# Patient Record
Sex: Female | Born: 1995
Health system: Southern US, Community
[De-identification: ages and names within clinical notes are randomized; demographics above are authoritative.]

## PROBLEM LIST (undated history)

## (undated) DIAGNOSIS — Q221 Congenital pulmonary valve stenosis: Secondary | ICD-10-CM

## (undated) DIAGNOSIS — J309 Allergic rhinitis, unspecified: Secondary | ICD-10-CM

## (undated) DIAGNOSIS — Q250 Patent ductus arteriosus: Secondary | ICD-10-CM

## (undated) DIAGNOSIS — N390 Urinary tract infection, site not specified: Secondary | ICD-10-CM

## (undated) DIAGNOSIS — I519 Heart disease, unspecified: Secondary | ICD-10-CM

## (undated) HISTORY — DX: Heart disease, unspecified: I51.9

## (undated) HISTORY — DX: Allergic rhinitis, unspecified: J30.9

## (undated) HISTORY — PX: WISDOM TOOTH EXTRACTION: SHX21

## (undated) HISTORY — PX: PULMONARY VALVULOPLASTY: SHX753

## (undated) HISTORY — DX: Patent ductus arteriosus: Q25.0

## (undated) HISTORY — DX: Urinary tract infection, site not specified: N39.0

## (undated) HISTORY — DX: Congenital pulmonary valve stenosis: Q22.1

---
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
## 2014-04-16 ENCOUNTER — Encounter: Payer: Self-pay | Admitting: Family Medicine

## 2014-04-16 ENCOUNTER — Ambulatory Visit (INDEPENDENT_AMBULATORY_CARE_PROVIDER_SITE_OTHER): Payer: BC Managed Care – PPO | Admitting: Family Medicine

## 2014-04-16 VITALS — BP 118/78 | HR 86 | Temp 98.4°F | Wt 160.5 lb

## 2014-04-16 DIAGNOSIS — R079 Chest pain, unspecified: Secondary | ICD-10-CM

## 2014-04-16 DIAGNOSIS — R0789 Other chest pain: Secondary | ICD-10-CM | POA: Insufficient documentation

## 2014-04-16 NOTE — Assessment & Plan Note (Signed)
Anticipate recurrent costochondritis after initial viral syndrome. Treat with heating pad and aleve 440mg  bid x5 days with food. Update if sxs persist or fail to improve. Pt/mom agree with plan. Check EKG to r/o pericarditis in h/o same.  EKG - NSR rate 75, normal axis, intervals, no acute ST/T changes

## 2014-04-16 NOTE — Patient Instructions (Signed)
For recurrent costochondritis - treat with heating pad and aleve 220mg  2 tablets twice daily with food for 5 days. Get plenty of rest over the weekend. EKG today - overall ok. Let us know if not improving as expected.  Costochondritis Costochondritis, sometimes called Tietze syndrome, is a swelling and irritation (inflammation) of the tissue (cartilage) that connects your ribs with your breastbone (sternum). It causes pain in the chest and rib area. Costochondritis usually goes away on its own over time. It can take up to 6 weeks or longer to get better, especially if you are unable to limit your activities. CAUSES  Some cases of costochondritis have no known cause. Possible causes include:  Injury (trauma).  Exercise or activity such as lifting.  Severe coughing. SIGNS AND SYMPTOMS  Pain and tenderness in the chest and rib area.  Pain that gets worse when coughing or taking deep breaths.  Pain that gets worse with specific movements. DIAGNOSIS  Your health care provider will do a physical exam and ask about your symptoms. Chest X-rays or other tests may be done to rule out other problems. TREATMENT  Costochondritis usually goes away on its own over time. Your health care provider may prescribe medicine to help relieve pain. HOME CARE INSTRUCTIONS   Avoid exhausting physical activity. Try not to strain your ribs during normal activity. This would include any activities using chest, abdominal, and side muscles, especially if heavy weights are used.  Apply ice to the affected area for the first 2 days after the pain begins.  Put ice in a plastic bag.  Place a towel between your skin and the bag.  Leave the ice on for 20 minutes, 2-3 times a day.  Only take over-the-counter or prescription medicines as directed by your health care provider. SEEK MEDICAL CARE IF:  You have redness or swelling at the rib joints. These are signs of infection.  Your pain does not go away despite  rest or medicine. SEEK IMMEDIATE MEDICAL CARE IF:   Your pain increases or you are very uncomfortable.  You have shortness of breath or difficulty breathing.  You cough up blood.  You have worse chest pains, sweating, or vomiting.  You have a fever or persistent symptoms for more than 2-3 days.  You have a fever and your symptoms suddenly get worse. MAKE SURE YOU:   Understand these instructions.  Will watch your condition.  Will get help right away if you are not doing well or get worse. Document Released: 03/14/2005 Document Revised: 03/25/2013 Document Reviewed: 01/06/2013 [facility] Patient Information 2015 [place]. This information is not intended to replace advice given to you by your health care provider. Make sure you discuss any questions you have with your health care provider.

## 2014-04-16 NOTE — Progress Notes (Signed)
Pre visit review using our clinic review tool, if applicable. No additional management support is needed unless otherwise documented below in the visit note. 

## 2014-04-16 NOTE — Progress Notes (Signed)
   BP 118/78  Pulse 86  Temp(Src) 98.4 F (36.9 C) (Oral)  Wt 160 lb 8 oz (72.802 kg)  SpO2 98%  LMP 03/13/2014   CC: chest pain   Subjective:    Patient ID: Shirley Wang L Ellis, female    DOB: Oct 24, 1995, 18 y.o.   MRN: [id number]  HPI: Shirley ReddenCasey L Ellis is a 18 y.o. female presenting on 04/16/2014 for URI   4d h/o chest wall pain associated with night sweats.  Yesterday was the worse day, today feeling some better. Worse pain if leans forward. Reproducible chest wall pain.  No coughing, fevers/chills, chest or head congestion.   H/o costochondritis in the past s/p eval by cards (3-4 yrs ago).  Has been taking advil and aleve without improvement. Did receive flu shot 2 wks ago. A week after flu shot developed sinus drainage and wheezing.  No sick contacts at home.  Goes to college.  H/o pericarditis as child. Known PDA and pulm stenosis murmur.  Relevant past medical, surgical, family and social history reviewed and updated as indicated.  Allergies and medications reviewed and updated. Current Outpatient Prescriptions on File Prior to Visit  Medication Sig  . Multiple Vitamins-Minerals (MULTIVITAMIN PO) Take 1 tablet by mouth daily.  . fluticasone (FLONASE) 50 MCG/ACT nasal spray Place 2 sprays into the nose 2 (two) times daily.  Marland Kitchen. terconazole (TERAZOL 7) 0.4 % vaginal cream Place 1 applicator vaginally at bedtime.   No current facility-administered medications on file prior to visit.    Review of Systems Per HPI unless specifically indicated above    Objective:    BP 118/78  Pulse 86  Temp(Src) 98.4 F (36.9 C) (Oral)  Wt 160 lb 8 oz (72.802 kg)  SpO2 98%  LMP 03/13/2014  Physical Exam  Nursing note and vitals reviewed. Constitutional: She appears well-developed and well-nourished. No distress.  HENT:  Mouth/Throat: Oropharynx is clear and moist. No oropharyngeal exudate.  Eyes: Conjunctivae and EOM are normal. Pupils are equal, round, and reactive to light. No  scleral icterus.  Neck: Normal range of motion. Neck supple. No thyromegaly present.  Cardiovascular: Normal rate, regular rhythm and intact distal pulses.   Murmur (3/6 SEM) heard. Pulmonary/Chest: Effort normal. No respiratory distress. She has no wheezes. She has no rales. She exhibits tenderness.  Reproducible tenderness to palpation bilateral first costochondral joints  Lymphadenopathy:    She has no cervical adenopathy.  Skin: Skin is warm and dry. No rash noted.  Psychiatric: She has a normal mood and affect.       Assessment & Plan:   Problem List Items Addressed This Visit   Chest wall pain     Anticipate recurrent costochondritis after initial viral syndrome. Treat with heating pad and aleve 440mg  bid x5 days with food. Update if sxs persist or fail to improve. Pt/mom agree with plan. Check EKG to r/o pericarditis in h/o same.  EKG - NSR rate 75, normal axis, intervals, no acute ST/T changes     Other Visit Diagnoses   Chest pain, unspecified chest pain type    -  Primary    Relevant Orders       EKG 12-Lead (Completed)        Follow up plan: Return if symptoms worsen or fail to improve.

## 2015-03-16 ENCOUNTER — Telehealth: Payer: Self-pay

## 2015-03-16 NOTE — Telephone Encounter (Signed)
Marylu Lund DPR signed wanted to schedule appt for pt later on; pt is presently at college; pt having some chest pain and SOB upon exertion; transferred to team health for triage and Marylu Lund will cb for appt if needed.

## 2015-03-17 ENCOUNTER — Telehealth: Payer: Self-pay

## 2015-03-17 NOTE — Telephone Encounter (Signed)
Received faxed note from team health (this [facility] note was not sent electronically and was not in [facility] portal) (note in Dr Timoteo Expose in box)Janet pts mother (DPR signed) wants to make appt for pt; pt is in college in [place] and has h/a and chest pounding when walks up stairs; happens daily for about one month. Spoke with Marylu Lund and wants to schedule appt on Fri afternoon(that is when pt returns home from college and returns to college on Sun.) pt has appt with heart doctor 05/11/15. BP 115/72 on 03/16/15 at bedtime. Scheduled appt on 03/18/15 at 2:30 with Dr Ninfa Linden understands if pt condition changes or worsens prior to appt for pt to go to [facility] or ED.

## 2015-03-17 NOTE — Telephone Encounter (Signed)
Noted. Will see tomorrow. 

## 2015-03-18 ENCOUNTER — Encounter: Payer: Self-pay | Admitting: Family Medicine

## 2015-03-18 ENCOUNTER — Ambulatory Visit (INDEPENDENT_AMBULATORY_CARE_PROVIDER_SITE_OTHER): Payer: 59 | Admitting: Family Medicine

## 2015-03-18 VITALS — BP 110/78 | HR 88 | Temp 98.3°F | Wt 157.2 lb

## 2015-03-18 DIAGNOSIS — R0789 Other chest pain: Secondary | ICD-10-CM

## 2015-03-18 DIAGNOSIS — G4484 Primary exertional headache: Secondary | ICD-10-CM

## 2015-03-18 DIAGNOSIS — R51 Headache: Secondary | ICD-10-CM | POA: Diagnosis not present

## 2015-03-18 NOTE — Progress Notes (Signed)
Pre visit review using our clinic review tool, if applicable. No additional management support is needed unless otherwise documented below in the visit note. 

## 2015-03-18 NOTE — Patient Instructions (Signed)
For chest soreness - costochondritis likely. Treat with aleve and heating pad. For exertional headaches - we will call you next week with plan.

## 2015-03-18 NOTE — Progress Notes (Signed)
BP 110/78 mmHg  Pulse 88  Temp(Src) 98.3 F (36.8 C) (Oral)  Wt 157 lb 4 oz (71.328 kg)  SpO2 99%  LMP 02/18/2015   CC: headache, chest wall pain  Subjective:    Patient ID: Shirley Wang, female    DOB: 1995-12-16, 19 y.o.   MRN: [id number]  HPI: Shirley Wang is a 19 y.o. female presenting on 03/18/2015 for HA and Chest pounding with exertion   Presents with mom today.  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 Pounding headache throughout head 7-8/10 for 5 min. Then mild headache/head soreness that lasts the rest of the day. Sitting down and closing eyes makes headache better. Some dyspnea with walking up several flights of stairs. No stabbing head pains, no chest pressure/tightness. No fevers/chills, nausea, photo/phonophobia or visual aura. Doesn't think she's had palpitations. Checked pulse during a headache and it was 108. Hasn't tried anything for these headaches yet. 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 No personal or family h/o migraines. 1.5 wks ago one episode of presyncope "almost blacked out" - but no LOC and she continued walking to class then sat down and this went away. Denies other lightheadedness or vertigo.   Avoids caffeine. Drinking mainly water and some juice. Endorses 6-7 hours of sleep at night time. Not restful sleep (lives in apt complex with noisy college age students). Vision is fine, gets checked yearly. Doesn't grind teeth that she knows of. No allergic rhinitis history, no current congestion. Denies alcohol or new meds/supplements/substances.  1.5 wk h/o tenderness to palpation at chest wall similar to prior episodes of costochondritis.   She has appt scheduled with cards for 05/11/2015.  H/o pericarditis as child.  H/o costochondritis  s/p cards eval (3-4 yrs ago) and again 03/2014.  Known PDA and pulm stenosis murmur.   Relevant past medical, surgical, family and social history reviewed and updated as indicated. Interim medical history since our last visit reviewed. Allergies and medications reviewed and updated. Current Outpatient Prescriptions on File Prior to Visit  Medication Sig  . fluticasone (FLONASE) 50 MCG/ACT nasal spray Place 2 sprays into the nose 2 (two) times daily.  . Multiple Vitamins-Minerals (MULTIVITAMIN PO) Take 1 tablet by mouth daily.   No current facility-administered medications on file prior to visit.    Review of Systems Per HPI unless specifically indicated above     Objective:    BP 110/78 mmHg  Pulse 88  Temp(Src) 98.3 F (36.8 C) (Oral)  Wt 157 lb 4 oz (71.328 kg)  SpO2 99%  LMP 02/18/2015  Wt Readings from Last 3 Encounters:  03/18/15 157 lb 4 oz (71.328 kg)  04/16/14 160 lb 8 oz (72.802 kg) (88 %*, Z = 1.19)  12/21/13 157 lb 8 oz (71.442 kg) (87 %*, Z = 1.14)   * Growth percentiles are based on CDC 2-20 Years data.   Body mass index is 26.36 kg/(m^2).  Physical Exam  Constitutional: She is oriented to person, place, and time. She appears well-developed and well-nourished. No distress.  HENT:  Head: Normocephalic and atraumatic.  Right Ear: Hearing, tympanic membrane, external ear and ear canal normal.  Left Ear: Hearing, tympanic membrane, external ear and ear canal normal.  Nose: Right sinus exhibits no maxillary sinus tenderness and no  frontal sinus tenderness. Left sinus exhibits no maxillary sinus tenderness and no frontal sinus tenderness.  Mouth/Throat: Uvula is midline, oropharynx is clear and moist and mucous membranes are normal. No oropharyngeal exudate, posterior oropharyngeal edema, posterior oropharyngeal erythema or tonsillar abscesses.  Eyes: Conjunctivae and EOM are normal. Pupils are equal, round, and reactive to light. No scleral icterus.  EOMI without  nystagmus No papilledema appreciated  Neck: Normal range of motion. Neck supple. Carotid bruit is not present. No thyromegaly present.  Cardiovascular: Normal rate, regular rhythm and intact distal pulses.   Murmur (mid systolic murmur best at upper sternal border) heard. Pulmonary/Chest: Effort normal and breath sounds normal. No respiratory distress. She has no wheezes. She has no rales.  Musculoskeletal: She exhibits no edema.  Lymphadenopathy:    She has no cervical adenopathy.  Neurological: She is alert and oriented to person, place, and time. She has normal strength. No cranial nerve deficit or sensory deficit. She displays a negative Romberg sign. Coordination and gait normal.  CN 2-12 intact Station and gait intact FTN intact No pronator drift  Skin: Skin is warm and dry. No rash noted.  Nursing note and vitals reviewed.     Assessment & Plan:   Problem List Items Addressed This Visit    Exertional headache - Primary    1 mo history of new onset exertional headaches. No inciting etiology found today, nonfocal neurological exam and other than stable murmur exam normal. Discussed possible need for head imaging to r/o secondary cause but that I'd like to touch base with pt's PCP then will contact her with our recommendations. Pt/mom agree with plan. She will be home next week from Thursday through Monday and available for possible needed imaging. If primary exertional headache, discussed possible NSAID use (indocin) whenever expected exertion up stairs. Consider neurology referral for other recommendations ?ppx med indicated in this setting to avoid regular NSAID use. In interim, suggested avoid heavy exertion. I agree with upcoming cardiology eval.      Chest wall pain    Reproducible chest wall pain consistent with costochondritis - rec treat with aleve and heating pad.  EKG - NSR rate 80s, normal axis and intervals, no acute ST/T changes, unchanged from EKG dated 03/2014.        Other Visit Diagnoses    Other chest pain        Relevant Orders    EKG 12-Lead (Completed)        Follow up plan: Return if symptoms worsen or fail to improve.

## 2015-03-19 DIAGNOSIS — G4484 Primary exertional headache: Secondary | ICD-10-CM | POA: Insufficient documentation

## 2015-03-19 NOTE — Assessment & Plan Note (Signed)
1 mo history of new onset exertional headaches. No inciting etiology found today, nonfocal neurological exam and other than stable murmur exam normal. Discussed possible need for head imaging to r/o secondary cause but that I'd like to touch base with pt's PCP then will contact her with our recommendations. Pt/mom agree with plan. She will be home next week from Thursday through Monday and available for possible needed imaging. If primary exertional headache, discussed possible NSAID use (indocin) whenever expected exertion up stairs. Consider neurology referral for other recommendations ?ppx med indicated in this setting to avoid regular NSAID use. In interim, suggested avoid heavy exertion. I agree with upcoming cardiology eval.

## 2015-03-19 NOTE — Assessment & Plan Note (Signed)
Reproducible chest wall pain consistent with costochondritis - rec treat with aleve and heating pad.  EKG - NSR rate 80s, normal axis and intervals, no acute ST/T changes, unchanged from EKG dated 03/2014.

## 2015-03-21 ENCOUNTER — Telehealth: Payer: Self-pay | Admitting: Family Medicine

## 2015-03-21 DIAGNOSIS — G4484 Primary exertional headache: Secondary | ICD-10-CM

## 2015-03-21 MED ORDER — DIAZEPAM 2 MG PO TABS: 2.0000 mg | ORAL_TABLET | Freq: Two times a day (BID) | ORAL | Status: DC | PRN

## 2015-03-21 NOTE — Telephone Encounter (Signed)
plz phone in valium 

## 2015-03-21 NOTE — Telephone Encounter (Signed)
Spoke to patients mom and got her sscheduled for the MRI/MRA at [facility] for this Friday, 10/7. Patient is very claustrophobic and has had a shoulder MRI and when she had it they gave her a valium to take. Patients mom is requesting a valium be called into the [facility]. She is having the MRI at 4pm this Friday. She broke the pill in half b/c she was afraid to take a whole one.Also called the hospital to make them aware that she would be taking something that day.

## 2015-03-21 NOTE — Telephone Encounter (Signed)
touched base with PCP. Will order MRI and MRA of brain for further eval. See office note for availability

## 2015-03-22 NOTE — Telephone Encounter (Signed)
Rx called in as directed.   

## 2015-03-24 NOTE — Telephone Encounter (Signed)
Mrs ellis request status of valium rx.; unable to access demographics for DPR but advised Mrs Rennis Harding to ck with [facility].

## 2015-03-25 ENCOUNTER — Ambulatory Visit
Admission: RE | Admit: 2015-03-25 | Discharge: 2015-03-25 | Disposition: A | Discharge status: Home / Self Care | Payer: 59 | Source: Ambulatory Visit | Attending: Family Medicine | Admitting: Family Medicine

## 2015-03-25 DIAGNOSIS — R51 Headache: Secondary | ICD-10-CM | POA: Diagnosis present

## 2015-03-25 DIAGNOSIS — G4484 Primary exertional headache: Secondary | ICD-10-CM

## 2015-10-07 ENCOUNTER — Telehealth: Payer: Self-pay | Admitting: Family Medicine

## 2015-10-07 NOTE — Telephone Encounter (Signed)
Mom called pt needs copy of immunization for school  She needs asap Please advise when ready for pick up

## 2015-10-07 NOTE — Telephone Encounter (Signed)
Printed and mother advise copy ready for pick-up

## 2015-11-17 HISTORY — PX: CARDIAC SURGERY: SHX584

## 2015-12-12 ENCOUNTER — Encounter: Payer: Self-pay | Admitting: Family Medicine

## 2015-12-27 ENCOUNTER — Ambulatory Visit (INDEPENDENT_AMBULATORY_CARE_PROVIDER_SITE_OTHER): Payer: 59 | Admitting: Family Medicine

## 2015-12-27 ENCOUNTER — Encounter: Payer: Self-pay | Admitting: Family Medicine

## 2015-12-27 VITALS — BP 104/58 | HR 60 | Temp 98.0°F | Wt 163.5 lb

## 2015-12-27 DIAGNOSIS — M7582 Other shoulder lesions, left shoulder: Secondary | ICD-10-CM

## 2015-12-27 DIAGNOSIS — Z9889 Other specified postprocedural states: Secondary | ICD-10-CM

## 2015-12-27 DIAGNOSIS — I370 Nonrheumatic pulmonary valve stenosis: Secondary | ICD-10-CM | POA: Diagnosis not present

## 2015-12-27 LAB — COMPREHENSIVE METABOLIC PANEL
ALBUMIN: 4 g/dL (ref 3.5–5.2)
ALT: 18 U/L (ref 0–35)
AST: 14 U/L (ref 0–37)
Alkaline Phosphatase: 43 U/L (ref 39–117)
BUN: 13 mg/dL (ref 6–23)
CALCIUM: 9.8 mg/dL (ref 8.4–10.5)
CHLORIDE: 102 meq/L (ref 96–112)
CO2: 31 meq/L (ref 19–32)
CREATININE: 0.65 mg/dL (ref 0.40–1.20)
GFR: 122.43 mL/min (ref >60.00)
Glucose, Bld: 102 mg/dL — ABNORMAL HIGH (ref 70–99)
POTASSIUM: 4.1 meq/L (ref 3.5–5.1)
Sodium: 140 mEq/L (ref 135–145)
Total Bilirubin: 0.4 mg/dL (ref 0.2–1.2)
Total Protein: 7.1 g/dL (ref 6.0–8.3)

## 2015-12-27 NOTE — Assessment & Plan Note (Signed)
Post operative. Advised increasing dose of Ibuprofen and may be able to transition from oxycodone to tylenol in the next week or two- see AVS for further instructions.

## 2015-12-27 NOTE — Progress Notes (Signed)
Pre visit review using our clinic review tool, if applicable. No additional management support is needed unless otherwise documented below in the visit note. 

## 2015-12-27 NOTE — Assessment & Plan Note (Signed)
Discussed pain management today. Will also check BMET since she is taking NSAIDs now as well. Unclear why she was told she does not quality for cardiac rehab.  I feel she would be an excellent candidate.  Will route my [facility]. The patient indicates understanding of these issues and agrees with the plan. Orders Placed This Encounter  Procedures  . Comprehensive metabolic panel

## 2015-12-27 NOTE — Patient Instructions (Signed)
Great to see you.  Happy Birthday!  The maximum daily dose of Ibuprofen is 2400 mg.  So that means you take 600 mg up to four times a day or 800 mg three times a day.

## 2015-12-27 NOTE — Progress Notes (Signed)
Subjective:   Patient ID: Shirley Wang, female    DOB: [birth year], 19 y.o.   MRN: [id number]  Shirley ReddenCasey L Wang is a pleasant 20 y.o. year old female with known h/o pulmonary valve stenosis s/p valvuloplasty at age 21 months, who presents to clinic today with Hospitalization Follow-up  on 12/27/2015  HPI:  Hospital notes reviewed- [facility] 12/12/15  She had been followed by cardiology with routine echo every 2 years. Over past year, she developed exertional headaches.  Neuro work up was unremarkable.  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  On 6/21, Mr. Simonne ComeMill performed sinus venosus ASD and PAPVR repair, PDA ligation and tolerated procedure well.  She did develop a small pneumothorax at discharge which was [facility].  She has follow scheduled again with Dr. Arvilla MarketMills next week.  At last weeks appointment, pneumothorax was getting smaller.  She is still needing to take 1/2 an oxycodone twice daily.  She is now taking Ibuprofen 400 mg a day which helps a little.  Having incisional and left shoulder pain when she lifts her arm overhead. Current Outpatient Prescriptions on File Prior to Visit  Medication Sig Dispense Refill  . diazepam (VALIUM) 2 MG tablet Take 1 tablet (2 mg total) by mouth every 12 (twelve) hours as needed for anxiety. 2 tablet 0  . fluticasone (FLONASE) 50 MCG/ACT nasal spray Place 2 sprays into the nose 2 (two) times daily. 16 g 12  . Multiple Vitamins-Minerals (MULTIVITAMIN PO) Take 1 tablet by mouth daily.     No current facility-administered medications on file prior to visit.    No Known Allergies  Past Medical History  Diagnosis Date  . Asthma     occ wheeze with cold and persistent coughs in remote past  . Urinary tract infection 1999  . Pulmonic stenosis, congenital     Cong Valvar PS, ballooned at 11 months   . Allergic rhinitis     has used zyrtec  and nasonex in the past prn  . PDA (patent ductus arteriosus)     Past Surgical History  Procedure Laterality Date  . Pulmonary valvuloplasty      [facility]  . Cardiac surgery  11/2015    sinus venosus ASD and PAPVR repair, PDA ligation Arvilla Market([facility]    Family History  Problem Relation Age of Onset  . Hypertension Father   . Asthma Brother     Social History   Social History  . Marital Status: Single    Spouse Name: N/A  . Number of Children: N/A  . Years of Education: N/A   Occupational History  . Not on file.   Social History Main Topics  . Smoking status: Never Smoker   . Smokeless tobacco: Never Used  . Alcohol Use: No  . Drug Use: No  . Sexual Activity: Not on file   Other Topics Concern  . Not on file   Social History Narrative   The PMH, PSH, Social History, Family History, Medications, and allergies [facility], and have been updated if relevant.    Review of Systems  Constitutional: Negative.   Respiratory: Negative for shortness of breath.   Cardiovascular: Positive for chest pain.  Gastrointestinal: Positive for constipation.  Musculoskeletal:       +left shoulder pain  Skin: Negative.   Neurological: Negative.   Hematological: Negative.   All other systems  reviewed and are negative.      Objective:    BP 104/58 mmHg  Pulse 60  Temp(Src) 98 F (36.7 C) (Oral)  Wt 163 lb 8 oz (74.163 kg)  SpO2 99%  LMP 12/13/2015   Physical Exam  Constitutional: She is oriented to person, place, and time. She appears well-developed and well-nourished. No distress.  HENT:  Head: Normocephalic.  Eyes: Conjunctivae are normal.  Cardiovascular: Normal rate.   Pulmonary/Chest: Effort normal.  Incision c/d/i  Neurological: She is alert and oriented to person, place, and time. No cranial nerve deficit.  Skin: Skin is warm and dry. She is not diaphoretic.  Psychiatric: She has a normal mood and affect. Her behavior is normal. Judgment and  thought content normal.  Nursing note and vitals reviewed.         Assessment & Plan:   Status post cardiac surgery - Plan: Comprehensive metabolic panel  Pulmonic stenosis No Follow-up on file.

## 2016-05-31 ENCOUNTER — Telehealth: Payer: Self-pay | Admitting: Family Medicine

## 2016-05-31 NOTE — Telephone Encounter (Signed)
Mother Shirley LundJanet walked in with After visit summary [facility] Cardi[facility] . Patient had open heart surgery on 12/07/15. She has a Dental cleaning on 06/19/16 and they are requesting an Antibiotic be called in to [facility] per the instructions on the after visit summary. Cardiologist told patients mother she had to get the antibiotic from her PCP.After visit summary is in your in box with instructions to take antibiotics SBE prophylaxis for dental procedures.

## 2016-06-01 ENCOUNTER — Other Ambulatory Visit: Payer: Self-pay | Admitting: Internal Medicine

## 2016-06-01 MED ORDER — AMOXICILLIN 500 MG PO CAPS: ORAL_CAPSULE | ORAL | 0 refills | Status: DC

## 2016-06-01 NOTE — Telephone Encounter (Signed)
Rene KocherRegina, I'm not in the office until Monday.  Would you mind getting this from my box and sending abx?  Most likely just amoxicillin at typical SBE prophylaxis dose but I am not sure what the paper says.  Thanks so much!

## 2016-06-01 NOTE — Telephone Encounter (Signed)
eRx for Amoxil 2 g PO x 1 sent to pharmacy

## 2016-07-06 ENCOUNTER — Telehealth: Payer: Self-pay

## 2016-07-06 NOTE — Telephone Encounter (Signed)
PLEASE NOTE: All timestamps contained within this report are represented as [place] Standard Time. CONFIDENTIALTY NOTICE: This fax transmission is intended only for the addressee. It contains information that is legally privileged, confidential or otherwise protected from use or disclosure. If you are not the intended recipient, you are strictly prohibited from reviewing, disclosing, copying using or disseminating any of this information or taking any action in reliance on or regarding this information. If you have received this fax in error, please notify us immediately by telephone so that we can arrange for its return to [place]. Phone: [phone], Toll-Free: [phone], Fax: [phone] Page: 1 of 2 Call Id: [id number] [facility] Night - Client TELEPHONE ADVICE RECORD [facility] Medical Call Center Patient Name: Shirley Wang Gender: Female DOB: 1996/04/27 Age: 21 Y 4 M 27 D Return Phone Number: [phone] (Primary), [phone] (Secondary) Address: City/State/Zip: [place] Client [facility] Night - Client Client Site [facility] - Night Physician Ruthe Mannan - MD Contact Type Call Who Is Calling Patient / Member / Family / Caregiver Call Type Triage / Clinical Caller Name Dorcas Mcmurray Relationship To Patient Mother Return Phone Number [phone] (Primary) Chief Complaint Runny or Stuffy Nose Reason for Call Symptomatic / Request for Health Information Initial Comment Caller says that her dtr had open heart surgery in June and is wanting to know what she can take for a head cold. symptoms: runny nose, sneezing PreDisposition Home Care Translation No No Triage Reason Other Nurse Assessment Nurse: Anitra Lauth. RN, Almira Coaster Date/Time (Eastern Time): 07/05/2016 12:43:28 PM Confirm and document reason for call. If symptomatic, describe symptoms. ---Caller states on Monday, the patient started having runny nose, drainage is  clear. sneezing, headache and stuffy nose. No fever. Caller states patient has sore throat and cough. Caller states patient is away at college. Advised the caller nurse needs to speak to the patient. May call her at [phone] Does the patient have any new or worsening symptoms? ---Yes Will a triage be completed? ---No Select reason for no triage. ---Other Nurse: Anitra Lauth. RN, Almira Coaster Date/Time (Eastern Time): 07/05/2016 12:52:03 PM Confirm and document reason for call. If symptomatic, describe symptoms. ---Caller states a couple of days ago she started sneezing and runny nose and green today. Caller states mild sore throat, no fever, no cough. Caller states she has chest discomfort when she bends over. She denies this from the surgical area. Caller states she has a headache. Does the patient have any new or worsening symptoms? ---Yes Will a triage be completed? ---Yes Related visit to physician within the last 2 weeks? ---No Does the PT have any chronic conditions? (i.e. diabetes, asthma, etc.) ---Yes List chronic conditions. 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 PLEASE NOTE: All timestamps contained within this report are represented as [place] Standard Time. CONFIDENTIALTY NOTICE: This fax transmission is intended only for the addressee. It contains information that is legally privileged, confidential or otherwise protected from use or disclosure. If you are not the intended recipient, you are strictly prohibited from reviewing, disclosing, copying using or disseminating any of this information or taking any action in reliance on or regarding this information. If you have received this fax in error, please notify us immediately by telephone so that we can arrange for its return to [place]. Phone: [phone], Toll-Free: [phone], Fax: [phone] Page: 2 of 2 Call Id: [id number] Nurse Assessment Is the patient pregnant or possibly pregnant? (Ask all females between the  ages of  12-55) ---No Is this a behavioral health or substance abuse call? ---No Guidelines Guideline Title Affirmed Question Affirmed Notes Nurse Date/Time (Eastern Time) Common Cold Cold with no complications (all triage questions negative) Kirkeeng. RN, Almira CoasterGina 07/05/2016 12:56:50 PM Disp. Time Lamount Cohen(Eastern Time) Disposition Final User 07/05/2016 12:48:43 PM Attempt made - message left Kirkeeng. RN, Almira CoasterGina 07/05/2016 1:05:27 PM Home Care Yes Kirkeeng. RN, Koren BoundGina Caller Understands: Yes Disagree/Comply: Comply Care Advice Given Per Guideline HOME CARE: You should be able to treat this at home. * It sounds like an uncomplicated cold that we can treat at home. FOR A STUFFY NOSE - USE NASAL WASHES: HUMIDIFIER: If the air in your home is dry, use a humidifier. EXPECTED COURSE: * Fever 2-3 days * Nasal discharge 7-14 days * Cough 2-3 weeks. CALL BACK IF: * Fever lasts over 3 days * Runny nose lasts over 10 days * You become short of breath * You become worse. CARE ADVICE given per Colds (Adult) guideline. * Methods: There are several ways to perform nasal irrigation. You can use a saline nasal spray bottle (available over-the-counter), a rubber ear syringe, a medical syringe without the needle, or a NETI POT.

## 2016-10-06 IMAGING — MR MR HEAD W/O CM
8 series · 41 of 48 positions shown · non-contrast
Comparison: None.

CLINICAL DATA: New exertional headache related to walking upstairs.

EXAM:
MRI HEAD WITHOUT CONTRAST
MRA HEAD WITHOUT CONTRAST
TECHNIQUE: Multiplanar, multiecho pulse sequences of the brain and surrounding
structures were obtained without intravenous contrast. Angiographic
images of the head were obtained using MRA technique without
contrast.

[Series 2: T1 · sagittal · 5.0mm · 0.45mm/px · 4 of 27 slices shown (1 of 2)]
[im 1/27]
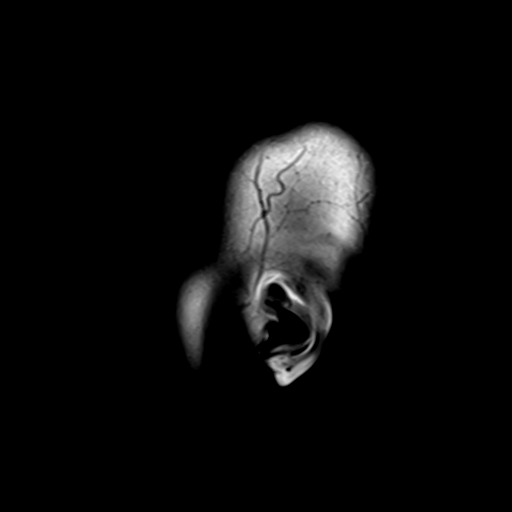
[im 9/27]
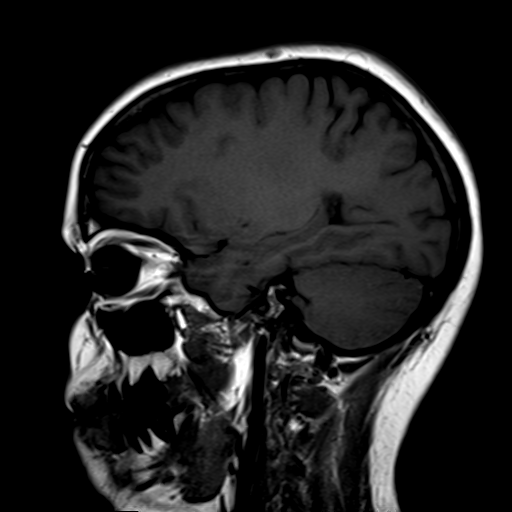
[im 18/27]
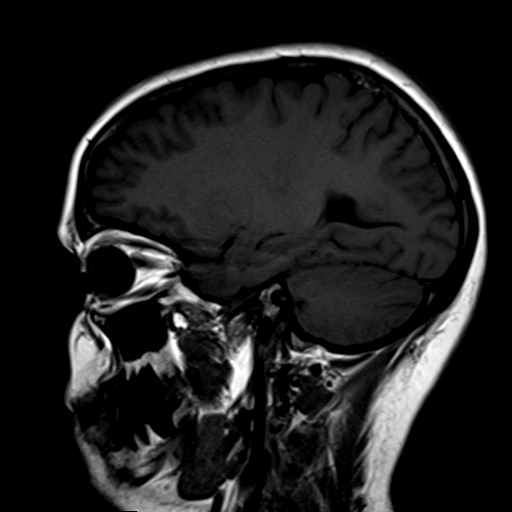
[im 27/27]
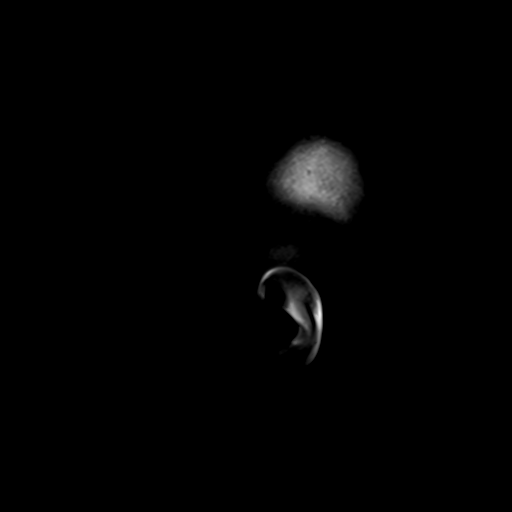

[Series 4: DWI · axial · 3.0mm · 1.80mm/px · z∈[-67,+101]mm · 9 of 57 slices shown (1 of 2)]
[im 1/57]
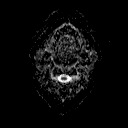
[im 8/57]
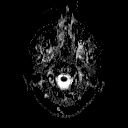
[im 15/57]
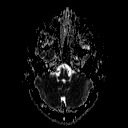
[im 22/57]
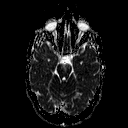
[im 29/57]
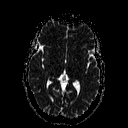
[im 36/57]
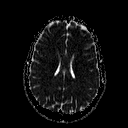
[im 43/57]
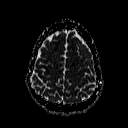
[im 50/57]
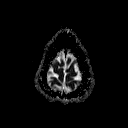
[im 57/57]
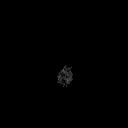

[Series 5: T2 · axial · 5.0mm · 0.60mm/px · z∈[-68,+101]mm · 4 of 27 slices shown (1 of 3)]
[im 1/27]
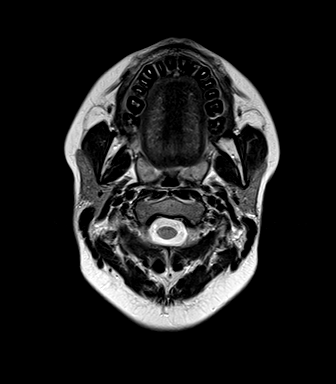
[im 9/27]
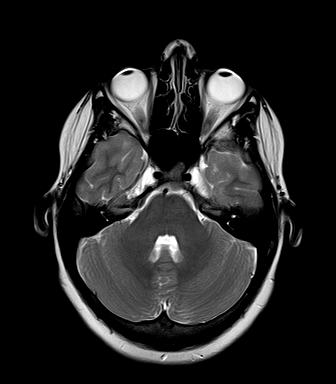
[im 18/27]
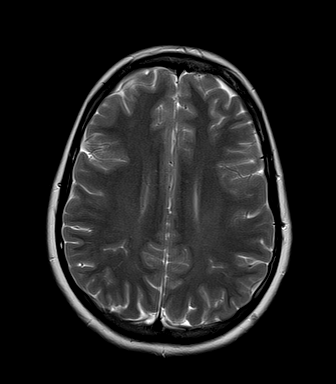
[im 27/27]
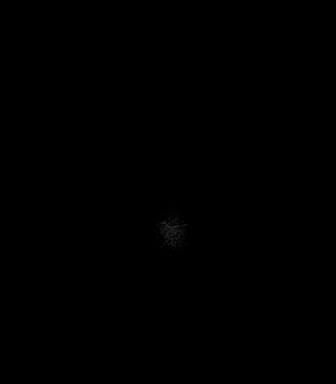

[Series 6: FLAIR · axial · 5.0mm · 0.45mm/px · z∈[-68,+101]mm · 4 of 27 slices shown]
[im 1/27]
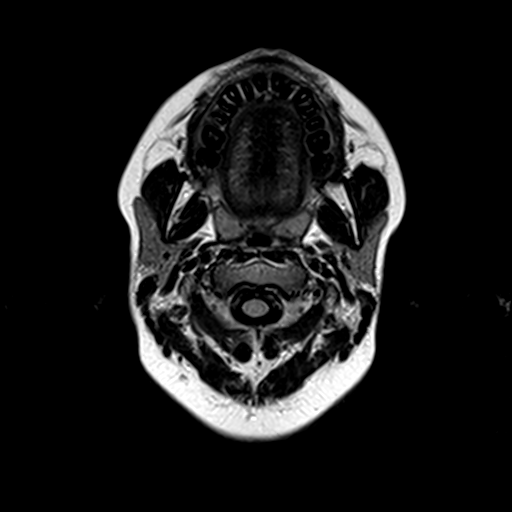
[im 9/27]
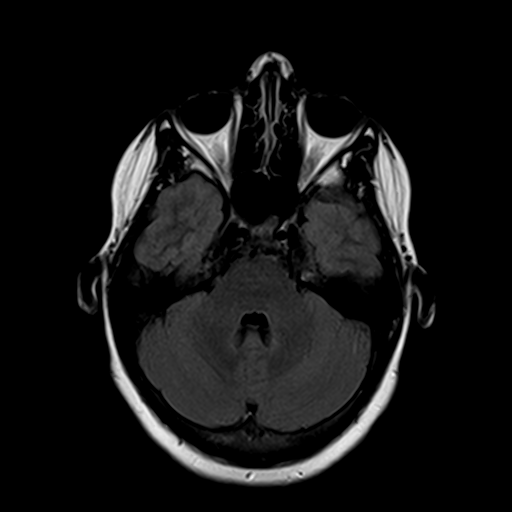
[im 18/27]
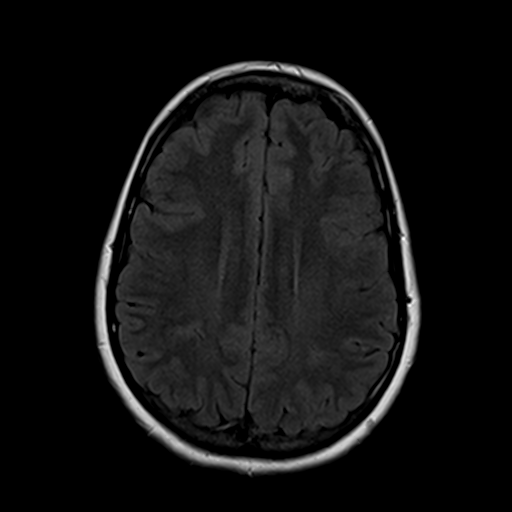
[im 27/27]
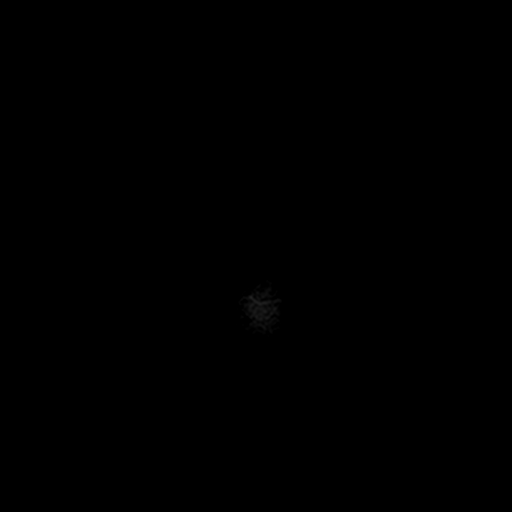

[Series 7: T2 · axial · 5.0mm · 0.45mm/px · z∈[-68,+101]mm · 4 of 27 slices shown (2 of 3)]
[im 1/27]
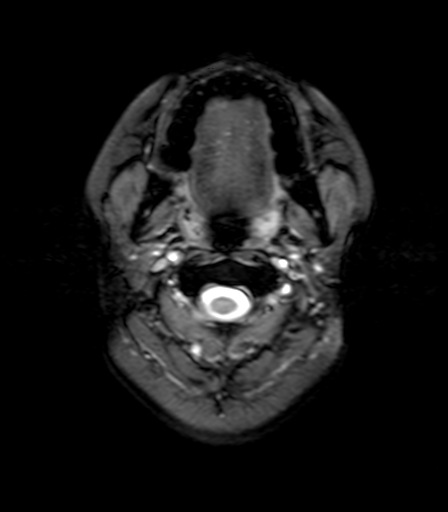
[im 9/27]
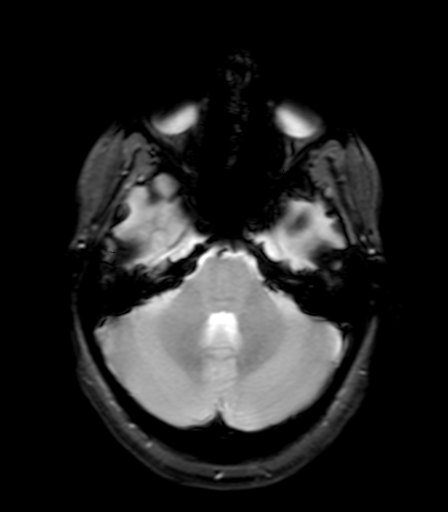
[im 18/27]
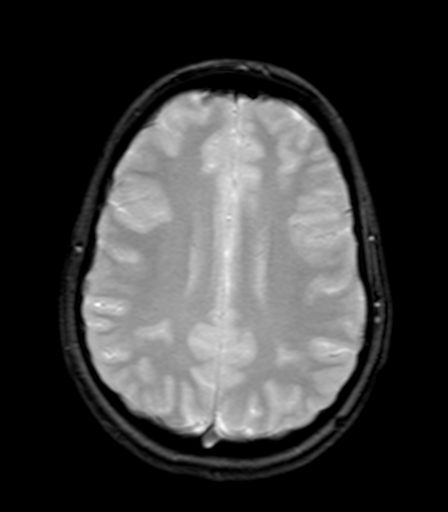
[im 27/27]
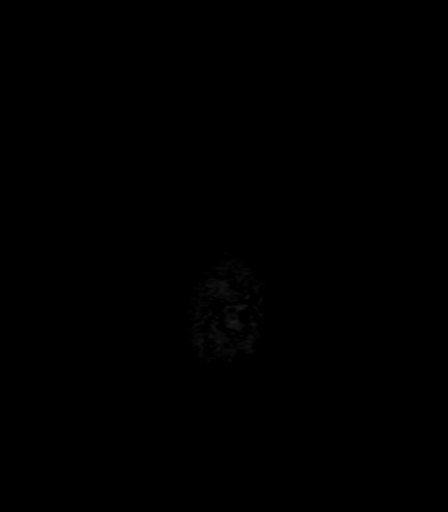

[Series 8: T1 · axial · 3.0mm · 1.00mm/px · z∈[-71,-50]mm · 2 of 60 slices shown (2 of 2)]
[im 1/60]
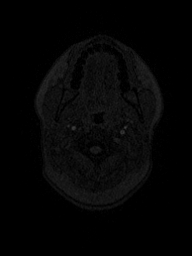
[im 8/60]
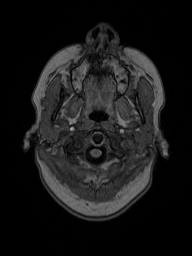

[Series 9: T2 · coronal · 5.0mm · 0.49mm/px · 5 of 31 slices shown (3 of 3)]
[im 1/31]
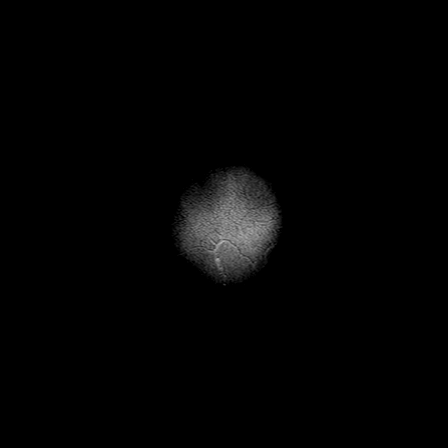
[im 8/31]
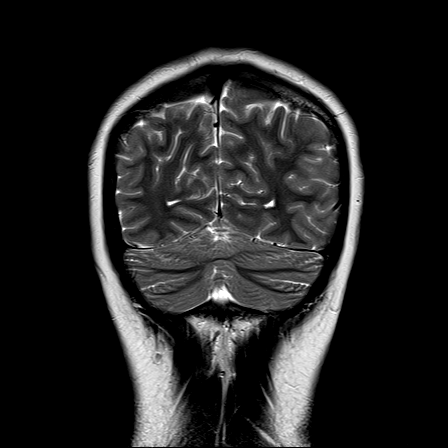
[im 16/31]
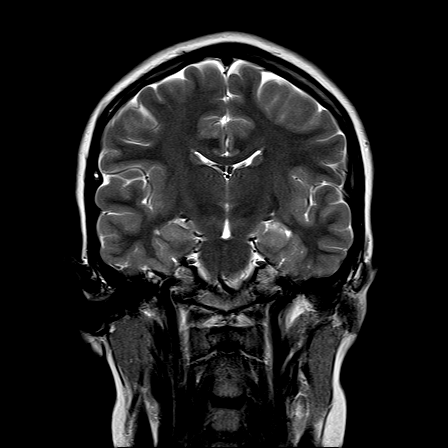
[im 23/31]
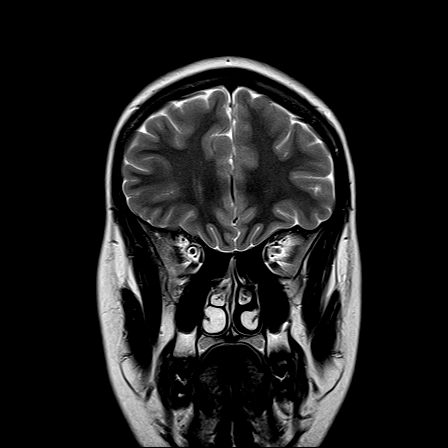
[im 31/31]
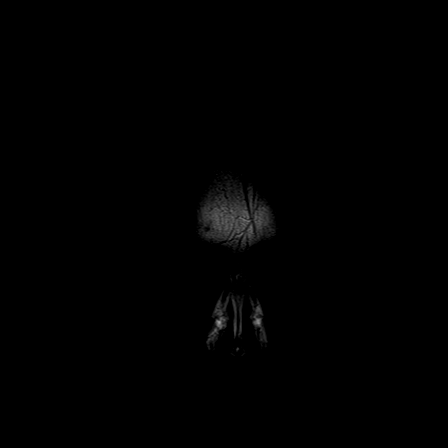

[Series 100: DWI · axial · 3.0mm · 1.80mm/px · z∈[-67,+101]mm · 9 of 56 slices shown (2 of 2)]
[im 1/56]
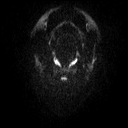
[im 7/56]
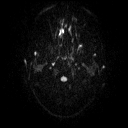
[im 14/56]
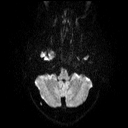
[im 21/56]
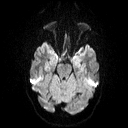
[im 28/56]
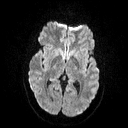
[im 35/56]
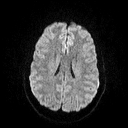
[im 42/56]
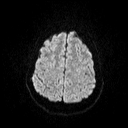
[im 49/56]
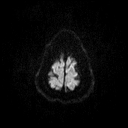
[im 56/56]
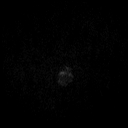

[41 of 48 positions shown; findings below may reference images not displayed]

FINDINGS: MRI HEAD FINDINGS

There is no evidence of acute infarct, intracranial hemorrhage,
mass, midline shift, or extra-axial fluid collection. Ventricles and
sulci are normal. Brain parenchyma is normal in signal.

Orbits are unremarkable. A small left maxillary sinus mucous
retention cyst is noted. Mastoid air cells are clear. Major
intracranial vascular flow voids are preserved.

MRA HEAD FINDINGS

The visualized distal vertebral arteries are patent with the left
being dominant. Left PICA origin is patent. AICA and SCA origins are
patent bilaterally. Basilar artery is patent without stenosis. There
are patent posterior communicating arteries bilaterally. PCAs are
unremarkable.

Internal carotid arteries are patent from skullbase to carotid
termini without stenosis. ACAs and MCAs are unremarkable. Anterior
communicating artery is patent. No intracranial aneurysm is
identified.
IMPRESSION: 1. Unremarkable appearance of the brain.
2. Unremarkable head MRA.

## 2016-10-06 IMAGING — MR MR MRA HEAD W/O CM
1 series · 23 of 48 positions shown · non-contrast
Comparison: None.

CLINICAL DATA: New exertional headache related to walking upstairs.

EXAM:
MRI HEAD WITHOUT CONTRAST
MRA HEAD WITHOUT CONTRAST
TECHNIQUE: Multiplanar, multiecho pulse sequences of the brain and surrounding
structures were obtained without intravenous contrast. Angiographic
images of the head were obtained using MRA technique without
contrast.

[Series 2: TOF · axial · non-contrast · 0.7mm · 0.37mm/px · z∈[-49,+30]mm · 23 of 120 slices shown]
[im 1/120]
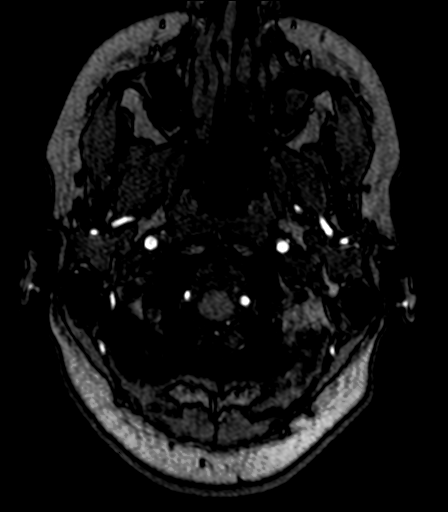
[im 3/120]
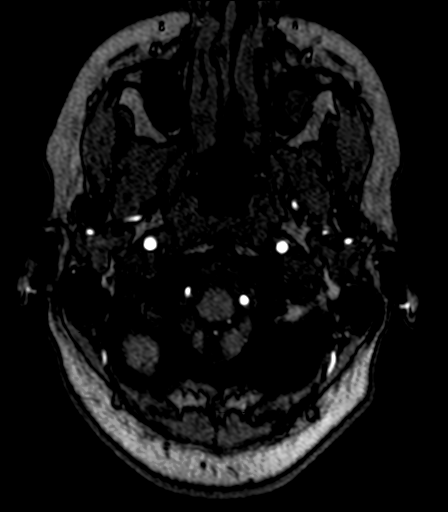
[im 6/120]
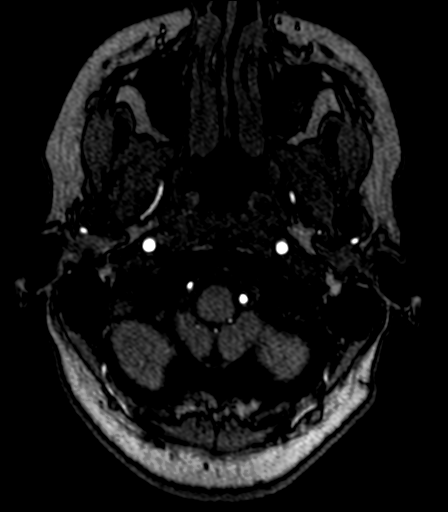
[im 8/120]
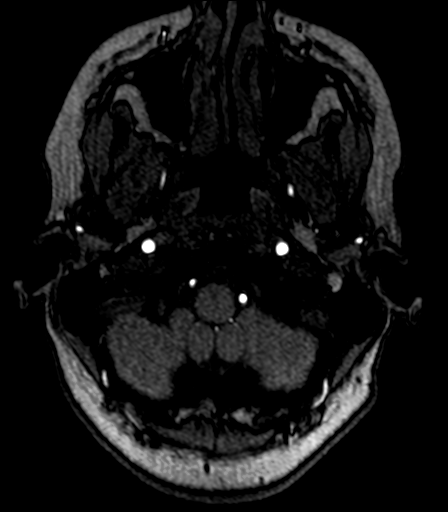
[im 11/120]
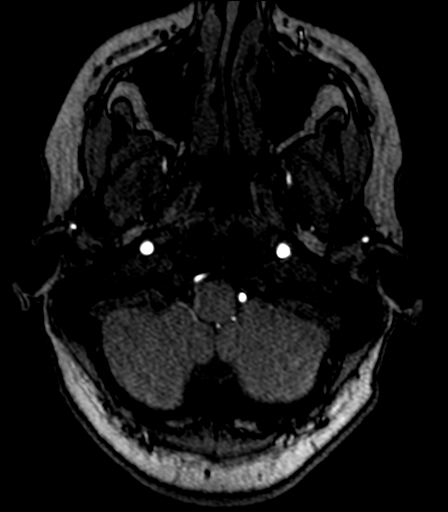
[im 13/120]
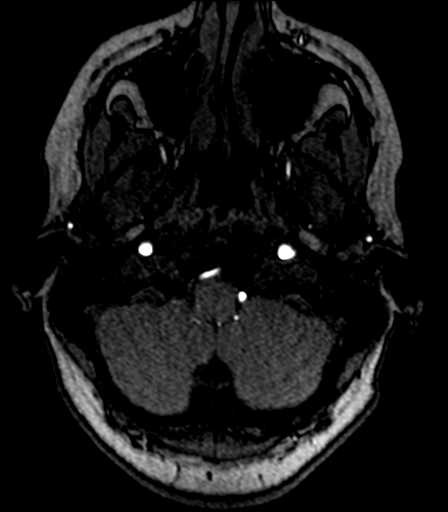
[im 16/120]
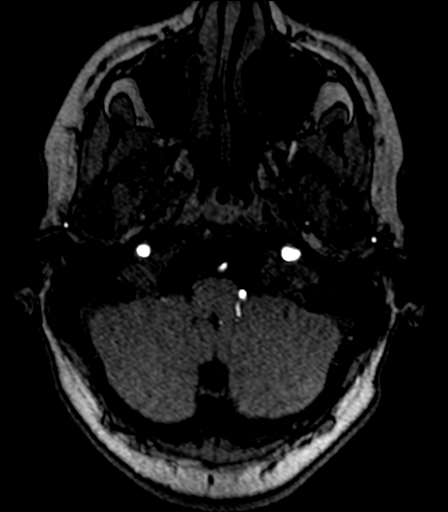
[im 18/120]
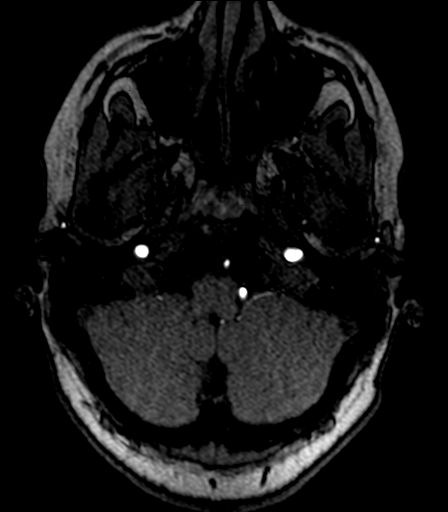
[im 21/120]
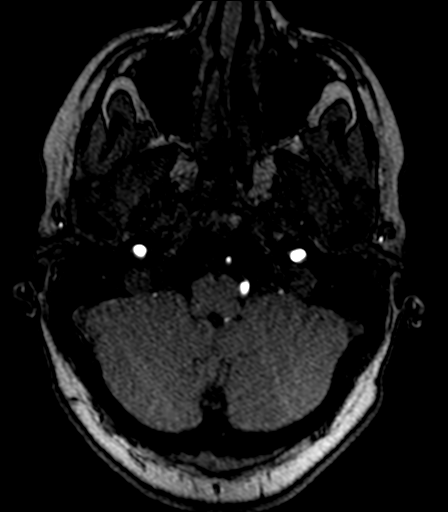
[im 23/120]
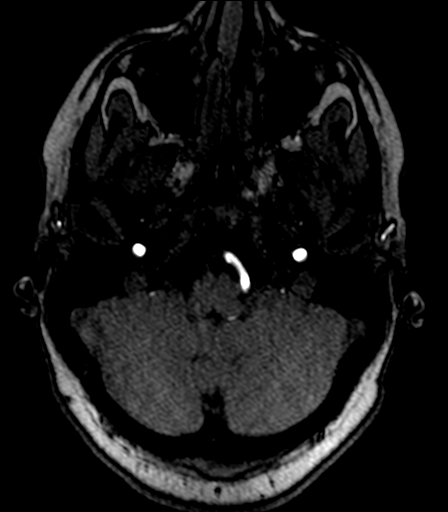
[im 26/120]
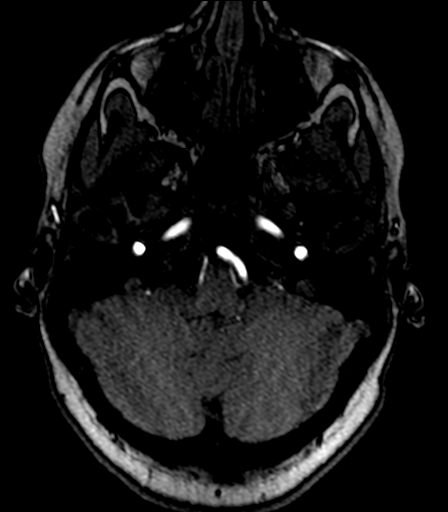
[im 28/120]
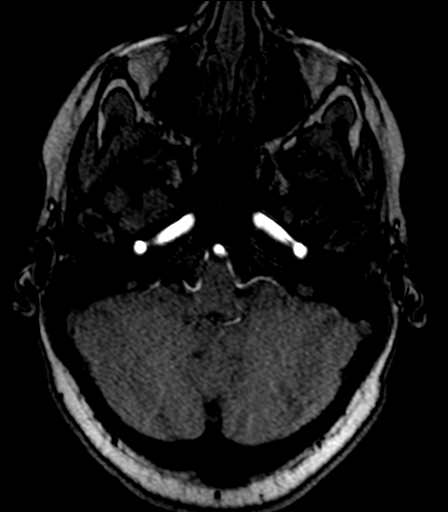
[im 31/120]
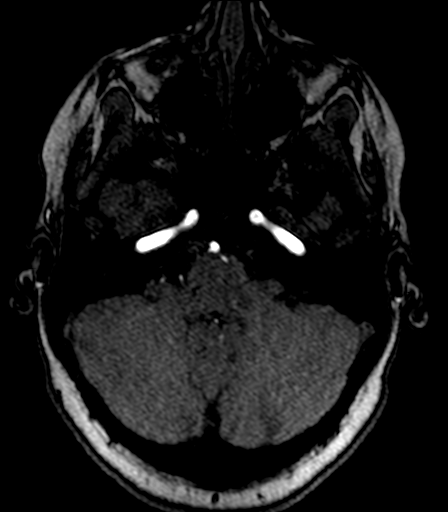
[im 33/120]
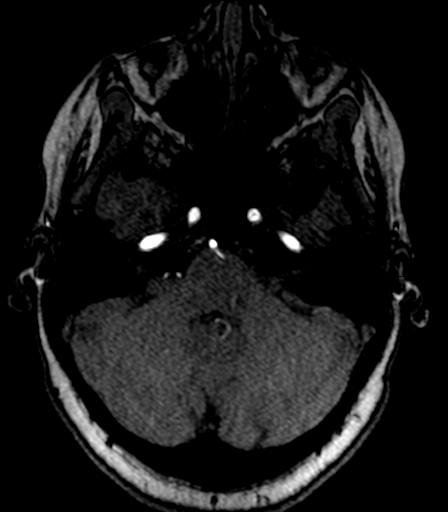
[im 36/120]
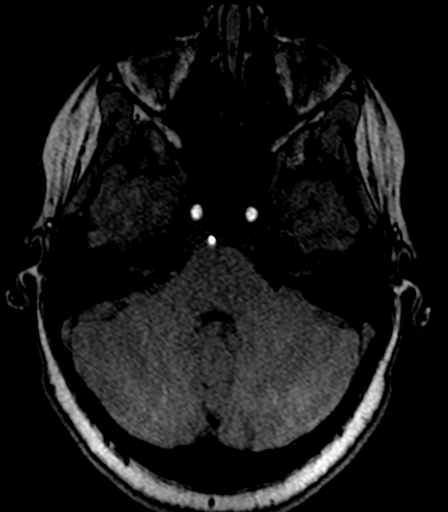
[im 38/120]
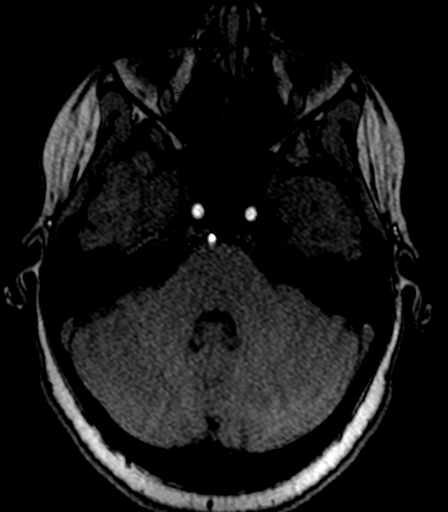
[im 54/120]
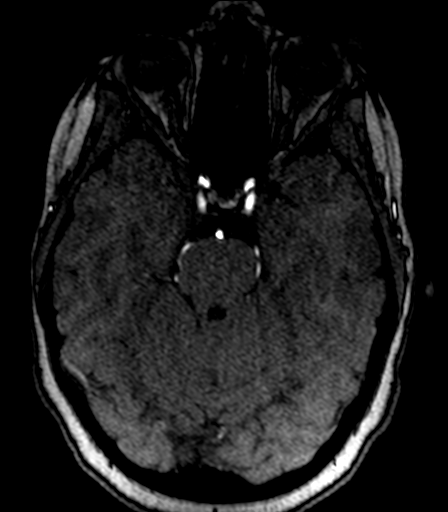
[im 61/120]
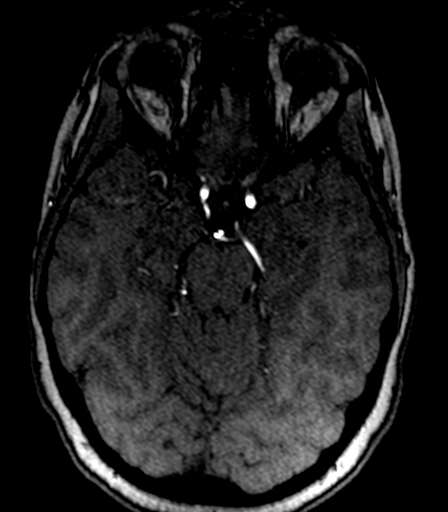
[im 69/120]
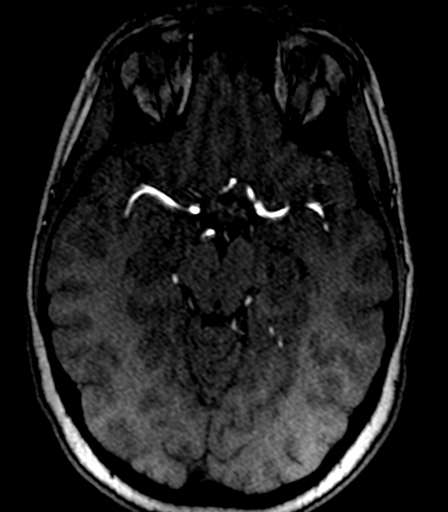
[im 84/120]
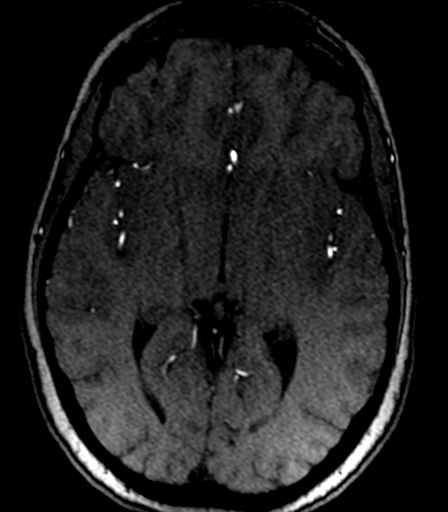
[im 99/120]
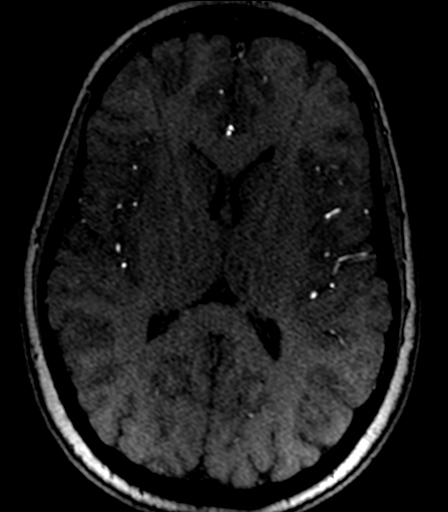
[im 102/120]
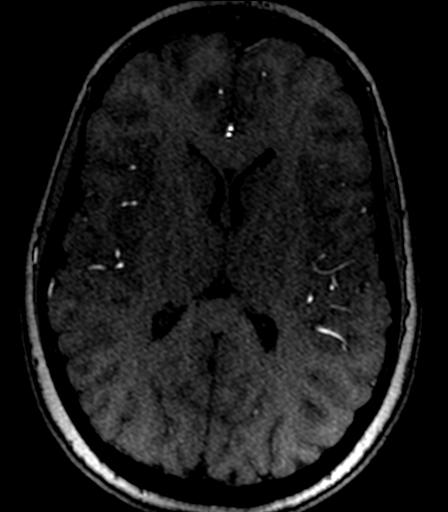
[im 114/120]
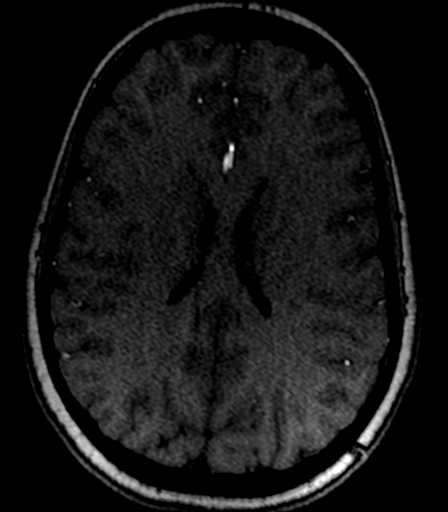

[23 of 48 positions shown; findings below may reference images not displayed]

FINDINGS: MRI HEAD FINDINGS

There is no evidence of acute infarct, intracranial hemorrhage,
mass, midline shift, or extra-axial fluid collection. Ventricles and
sulci are normal. Brain parenchyma is normal in signal.

Orbits are unremarkable. A small left maxillary sinus mucous
retention cyst is noted. Mastoid air cells are clear. Major
intracranial vascular flow voids are preserved.

MRA HEAD FINDINGS

The visualized distal vertebral arteries are patent with the left
being dominant. Left PICA origin is patent. AICA and SCA origins are
patent bilaterally. Basilar artery is patent without stenosis. There
are patent posterior communicating arteries bilaterally. PCAs are
unremarkable.

Internal carotid arteries are patent from skullbase to carotid
termini without stenosis. ACAs and MCAs are unremarkable. Anterior
communicating artery is patent. No intracranial aneurysm is
identified.
IMPRESSION: 1. Unremarkable appearance of the brain.
2. Unremarkable head MRA.

## 2016-10-22 ENCOUNTER — Other Ambulatory Visit: Payer: Self-pay | Admitting: Family Medicine

## 2016-10-22 DIAGNOSIS — Z025 Encounter for examination for participation in sport: Secondary | ICD-10-CM

## 2016-10-29 ENCOUNTER — Other Ambulatory Visit (INDEPENDENT_AMBULATORY_CARE_PROVIDER_SITE_OTHER): Payer: 59

## 2016-10-29 DIAGNOSIS — Z025 Encounter for examination for participation in sport: Secondary | ICD-10-CM | POA: Diagnosis not present

## 2016-10-29 LAB — CBC WITH DIFFERENTIAL/PLATELET
BASOS ABS: 0 10*3/uL (ref 0.0–0.1)
Basophils Relative: 0.8 % (ref 0.0–3.0)
Eosinophils Absolute: 0.3 10*3/uL (ref 0.0–0.7)
Eosinophils Relative: 4.5 % (ref 0.0–5.0)
HEMATOCRIT: 39.6 % (ref 36.0–46.0)
Hemoglobin: 13.2 g/dL (ref 12.0–15.0)
LYMPHS PCT: 28.5 % (ref 12.0–46.0)
Lymphs Abs: 1.6 10*3/uL (ref 0.7–4.0)
MCHC: 33.4 g/dL (ref 30.0–36.0)
MCV: 88 fl (ref 78.0–100.0)
MONOS PCT: 6.1 % (ref 3.0–12.0)
Monocytes Absolute: 0.4 10*3/uL (ref 0.1–1.0)
NEUTROS PCT: 60.1 % (ref 43.0–77.0)
Neutro Abs: 3.4 10*3/uL (ref 1.4–7.7)
Platelets: 229 10*3/uL (ref 150.0–400.0)
RBC: 4.5 Mil/uL (ref 3.87–5.11)
RDW: 13.2 % (ref 11.5–15.5)
WBC: 5.7 10*3/uL (ref 4.0–10.5)

## 2016-10-29 LAB — COMPREHENSIVE METABOLIC PANEL
ALT: 10 U/L (ref 0–35)
AST: 16 U/L (ref 0–37)
Albumin: 4.3 g/dL (ref 3.5–5.2)
Alkaline Phosphatase: 46 U/L (ref 39–117)
BUN: 11 mg/dL (ref 6–23)
CHLORIDE: 104 meq/L (ref 96–112)
CO2: 28 meq/L (ref 19–32)
Calcium: 9.2 mg/dL (ref 8.4–10.5)
Creatinine, Ser: 0.63 mg/dL (ref 0.40–1.20)
GFR: 125.91 mL/min (ref >60.00)
GLUCOSE: 86 mg/dL (ref 70–99)
POTASSIUM: 3.9 meq/L (ref 3.5–5.1)
SODIUM: 137 meq/L (ref 135–145)
Total Bilirubin: 0.4 mg/dL (ref 0.2–1.2)
Total Protein: 6.9 g/dL (ref 6.0–8.3)

## 2016-10-29 LAB — LIPID PANEL
Cholesterol: 145 mg/dL (ref 0–200)
HDL: 59.2 mg/dL (ref >39.00)
LDL CALC: 72 mg/dL (ref 0–99)
NONHDL: 85.51
Total CHOL/HDL Ratio: 2
Triglycerides: 70 mg/dL (ref 0.0–149.0)
VLDL: 14 mg/dL (ref 0.0–40.0)

## 2016-10-29 LAB — TSH: TSH: 1.61 u[IU]/mL (ref 0.35–4.50)

## 2016-10-30 ENCOUNTER — Ambulatory Visit (INDEPENDENT_AMBULATORY_CARE_PROVIDER_SITE_OTHER): Payer: 59 | Admitting: Family Medicine

## 2016-10-30 ENCOUNTER — Encounter: Payer: Self-pay | Admitting: Family Medicine

## 2016-10-30 ENCOUNTER — Other Ambulatory Visit (HOSPITAL_COMMUNITY)
Admission: RE | Admit: 2016-10-30 | Discharge: 2016-10-30 | Disposition: A | Discharge status: Home / Self Care | Payer: 59 | Source: Ambulatory Visit | Attending: Family Medicine | Admitting: Family Medicine

## 2016-10-30 VITALS — BP 120/80 | HR 78 | Ht 64.75 in | Wt 173.0 lb

## 2016-10-30 DIAGNOSIS — Z01419 Encounter for gynecological examination (general) (routine) without abnormal findings: Secondary | ICD-10-CM | POA: Insufficient documentation

## 2016-10-30 DIAGNOSIS — I370 Nonrheumatic pulmonary valve stenosis: Secondary | ICD-10-CM

## 2016-10-30 DIAGNOSIS — N926 Irregular menstruation, unspecified: Secondary | ICD-10-CM | POA: Insufficient documentation

## 2016-10-30 MED ORDER — NORETHINDRONE ACET-ETHINYL EST 1-20 MG-MCG PO TABS: 1.0000 | ORAL_TABLET | Freq: Every day | ORAL | 11 refills | Status: DC

## 2016-10-30 NOTE — Progress Notes (Signed)
.  Subjective:   Patient ID: Shirley Wang, female    DOB: 10-05-1995, 21 y.o.   MRN: [id number]  Shirley Wang is a pleasant 21 y.o. year old female who presents to clinic today with Annual Exam and Amenorrhea (LMP 08/07/16)  on 10/30/2016  HPI:  G0 Has never had a pap smear.  She is having very irregular periods. Has not had a period since 07/2016.  She feels her periods have been very irregular since her heart surgery.  Does have a PMH significant for ovarian cysts.  Pulmonary valve stenosis- saw her cardiologist, Dr. Ace GinsBuck on 08/23/2016. Note reviewed.  Lab Results  Component Value Date   CHOL 145 10/29/2016   HDL 59.20 10/29/2016   LDLCALC 72 10/29/2016   TRIG 70.0 10/29/2016   CHOLHDL 2 10/29/2016   Lab Results  Component Value Date   NA 137 10/29/2016   K 3.9 10/29/2016   CL 104 10/29/2016   CO2 28 10/29/2016   Lab Results  Component Value Date   CREATININE 0.63 10/29/2016   Lab Results  Component Value Date   ALT 10 10/29/2016   AST 16 10/29/2016   ALKPHOS 46 10/29/2016   BILITOT 0.4 10/29/2016   Lab Results  Component Value Date   WBC 5.7 10/29/2016   HGB 13.2 10/29/2016   HCT 39.6 10/29/2016   MCV 88.0 10/29/2016   PLT 229.0 10/29/2016   Lab Results  Component Value Date   TSH 1.61 10/29/2016   Current Outpatient Prescriptions on File Prior to Visit  Medication Sig Dispense Refill  . amoxicillin (AMOXIL) 500 MG capsule Take 4 tablets PO x 1 prior to dental exam 4 capsule 0  . fluticasone (FLONASE) 50 MCG/ACT nasal spray Place 2 sprays into the nose 2 (two) times daily. 16 g 12  . Multiple Vitamins-Minerals (MULTIVITAMIN PO) Take 1 tablet by mouth daily.    . furosemide (LASIX) 40 MG tablet Take 20 mg by mouth.     No current facility-administered medications on file prior to visit.     No Known Allergies  Past Medical History:  Diagnosis Date  . Allergic rhinitis    has used zyrtec and nasonex in the past prn  . Asthma    occ wheeze with  cold and persistent coughs in remote past  . PDA (patent ductus arteriosus)   . Pulmonic stenosis, congenital    Cong Valvar PS, ballooned at 11 months   . Urinary tract infection 1999    Past Surgical History:  Procedure Laterality Date  . CARDIAC SURGERY  11/2015   sinus venosus ASD and PAPVR repair, PDA ligation [facility]  . PULMONARY VALVULOPLASTY     [facility]    Family History  Problem Relation Age of Onset  . Hypertension Father   . Asthma Brother     Social History   Social History  . Marital status: Single    Spouse name: N/A  . Number of children: N/A  . Years of education: N/A   Occupational History  . Not on file.   Social History Main Topics  . Smoking status: Never Smoker  . Smokeless tobacco: Never Used  . Alcohol use No  . Drug use: No  . Sexual activity: Not on file   Other Topics Concern  . Not on file   Social History Narrative  . No narrative on file   The PMH, PSH, Social History, Family History, Medications, and allergies [facility],  and have been updated if relevant.   Review of Systems  Constitutional: Negative.   HENT: Negative.   Eyes: Negative.   Respiratory: Negative.   Cardiovascular: Negative.   Gastrointestinal: Negative.   Endocrine: Negative.   Genitourinary: Positive for menstrual problem. Negative for pelvic pain.  Musculoskeletal: Negative.   Skin: Negative.   Allergic/Immunologic: Negative.   Neurological: Negative.   Hematological: Negative.   Psychiatric/Behavioral: Negative.   All other systems reviewed and are negative.      Objective:    BP 120/80   Pulse 78   Ht 5' 4.75" (1.645 m)   Wt 173 lb (78.5 kg)   LMP 08/07/2016   SpO2 99%   BMI 29.01 kg/m    Physical Exam    General:  Well-developed,well-nourished,in no acute distress; alert,appropriate and cooperative throughout examination Head:  normocephalic and atraumatic.   Eyes:  vision grossly intact, PERRL Ears:  R ear  normal and L ear normal externally, TMs clear bilaterally Nose:  no external deformity.   Mouth:  good dentition.   Neck:  No deformities, masses, or tenderness noted. Breasts:  No mass, nodules, thickening, tenderness, bulging, retraction, inflamation, nipple discharge or skin changes noted.   Lungs:  Normal respiratory effort, chest expands symmetrically. Lungs are clear to auscultation, no crackles or wheezes. Heart:  Normal rate and regular rhythm. S1 and S2 normal without gallop, murmur, click, rub or other extra sounds. Abdomen:  Bowel sounds positive,abdomen soft and non-tender without masses, organomegaly or hernias noted. Rectal:  no external abnormalities.   Genitalia:  Pelvic Exam:        External: normal female genitalia without lesions or masses        Vagina: normal without lesions or masses        Cervix: normal without lesions or masses        Adnexa: normal bimanual exam without masses or fullness        Uterus: normal by palpation        Pap smear: performed Msk:  No deformity or scoliosis noted of thoracic or lumbar spine.   Extremities:  No clubbing, cyanosis, edema, or deformity noted with normal full range of motion of all joints.   Neurologic:  alert & oriented X3 and gait normal.   Skin:  Intact without suspicious lesions or rashes Cervical Nodes:  No lymphadenopathy noted Axillary Nodes:  No palpable lymphadenopathy Psych:  Cognition and judgment appear intact. Alert and cooperative with normal attention span and concentration. No apparent delusions, illusions, hallucinations       Assessment & Plan:   Pulmonary valve stenosis, unspecified etiology  Well woman exam - Plan: Cytology - PAP No Follow-up on file.

## 2016-10-30 NOTE — Assessment & Plan Note (Signed)
Discussed tx options with pt. She is not sexually active but would like OCPs for regulation of her periods. eRx sent for loestrin. She will update me in a few months.

## 2016-10-30 NOTE — Assessment & Plan Note (Signed)
Reviewed preventive care protocols, scheduled due services, and updated immunizations Discussed nutrition, exercise, diet, and healthy lifestyle.  Pap smear done today. 

## 2016-11-01 LAB — CYTOLOGY - PAP: Diagnosis: NEGATIVE

## 2016-12-18 ENCOUNTER — Other Ambulatory Visit: Payer: Self-pay

## 2016-12-18 MED ORDER — AMOXICILLIN 500 MG PO CAPS: ORAL_CAPSULE | ORAL | 0 refills | Status: DC

## 2016-12-18 NOTE — Telephone Encounter (Signed)
Pt notified done and pt voiced understanding; pt will ck with pharmacy.

## 2016-12-18 NOTE — Telephone Encounter (Signed)
Pt left v/m requesting abx filled before dental work due to pt having open heart surgery last year. Pt request cb when done. Last printed # 4 on 06/01/16. Pt last seen 10/30/16 for annual exam. [facility].

## 2017-04-15 ENCOUNTER — Telehealth: Payer: Self-pay | Admitting: Family Medicine

## 2017-04-15 MED ORDER — DROSPIRENONE-ETHINYL ESTRADIOL 3-0.02 MG PO TABS: 1.0000 | ORAL_TABLET | Freq: Every day | ORAL | 11 refills | Status: DC

## 2017-04-15 NOTE — Telephone Encounter (Signed)
Pt aware/thx dmf 

## 2017-04-15 NOTE — Telephone Encounter (Signed)
Spoke with patient.  She is requesting to try a different birth control pill.  See note. If request approved she would like to pick up prescription by the end of today as she is leaving tomorrow for college.

## 2017-04-15 NOTE — Telephone Encounter (Signed)
Copied from CRM #[id number]. Topic: Quick Communication - See Telephone Encounter >> Apr 15, 2017 11:16 AM Arlyss Gandyichardson, Brittinee Risk N, NT wrote: CRM for notification. See Telephone encounter for: 04/15/17. Patients new birth control is causing her to have several bad medication symptoms. She is having headaches, dizziness, stomach cramps and her insurance will not cover it. She would like a nurse to call her as soon as possible. She returns to school in [place] tomorrow.

## 2017-04-15 NOTE — Telephone Encounter (Signed)
Let's try Yaz.  eRx sent.

## 2017-11-04 ENCOUNTER — Ambulatory Visit (INDEPENDENT_AMBULATORY_CARE_PROVIDER_SITE_OTHER): Payer: 59 | Admitting: Family Medicine

## 2017-11-04 ENCOUNTER — Encounter: Payer: Self-pay | Admitting: Family Medicine

## 2017-11-04 VITALS — BP 110/74 | HR 71 | Temp 98.6°F | Ht 65.25 in | Wt 166.4 lb

## 2017-11-04 DIAGNOSIS — Z9889 Other specified postprocedural states: Secondary | ICD-10-CM | POA: Diagnosis not present

## 2017-11-04 DIAGNOSIS — Z01419 Encounter for gynecological examination (general) (routine) without abnormal findings: Secondary | ICD-10-CM

## 2017-11-04 DIAGNOSIS — Z0000 Encounter for general adult medical examination without abnormal findings: Secondary | ICD-10-CM | POA: Diagnosis not present

## 2017-11-04 LAB — CBC WITH DIFFERENTIAL/PLATELET
Basophils Absolute: 0 10*3/uL (ref 0.0–0.1)
Basophils Relative: 0.8 % (ref 0.0–3.0)
EOS ABS: 0.4 10*3/uL (ref 0.0–0.7)
Eosinophils Relative: 7.9 % — ABNORMAL HIGH (ref 0.0–5.0)
HCT: 40 % (ref 36.0–46.0)
Hemoglobin: 13.4 g/dL (ref 12.0–15.0)
LYMPHS ABS: 1.4 10*3/uL (ref 0.7–4.0)
Lymphocytes Relative: 25.4 % (ref 12.0–46.0)
MCHC: 33.5 g/dL (ref 30.0–36.0)
MCV: 87.8 fl (ref 78.0–100.0)
MONOS PCT: 4.8 % (ref 3.0–12.0)
Monocytes Absolute: 0.3 10*3/uL (ref 0.1–1.0)
NEUTROS ABS: 3.3 10*3/uL (ref 1.4–7.7)
NEUTROS PCT: 61.1 % (ref 43.0–77.0)
PLATELETS: 240 10*3/uL (ref 150.0–400.0)
RBC: 4.55 Mil/uL (ref 3.87–5.11)
RDW: 12.5 % (ref 11.5–15.5)
WBC: 5.4 10*3/uL (ref 4.0–10.5)

## 2017-11-04 LAB — COMPREHENSIVE METABOLIC PANEL
ALT: 9 U/L (ref 0–35)
AST: 13 U/L (ref 0–37)
Albumin: 4.2 g/dL (ref 3.5–5.2)
Alkaline Phosphatase: 38 U/L — ABNORMAL LOW (ref 39–117)
BILIRUBIN TOTAL: 0.4 mg/dL (ref 0.2–1.2)
BUN: 10 mg/dL (ref 6–23)
CHLORIDE: 104 meq/L (ref 96–112)
CO2: 27 meq/L (ref 19–32)
Calcium: 9.7 mg/dL (ref 8.4–10.5)
Creatinine, Ser: 0.69 mg/dL (ref 0.40–1.20)
GFR: 112.32 mL/min (ref >60.00)
GLUCOSE: 88 mg/dL (ref 70–99)
Potassium: 4.5 mEq/L (ref 3.5–5.1)
Sodium: 140 mEq/L (ref 135–145)
Total Protein: 7 g/dL (ref 6.0–8.3)

## 2017-11-04 LAB — LIPID PANEL
Cholesterol: 160 mg/dL (ref 0–200)
HDL: 72.8 mg/dL (ref >39.00)
LDL CALC: 65 mg/dL (ref 0–99)
NonHDL: 86.86
TRIGLYCERIDES: 110 mg/dL (ref 0.0–149.0)
Total CHOL/HDL Ratio: 2
VLDL: 22 mg/dL (ref 0.0–40.0)

## 2017-11-04 LAB — TSH: TSH: 1.33 u[IU]/mL (ref 0.35–4.50)

## 2017-11-04 NOTE — Assessment & Plan Note (Signed)
Reviewed preventive care protocols, scheduled due services, and updated immunizations Discussed nutrition, exercise, diet, and healthy lifestyle.  

## 2017-11-04 NOTE — Patient Instructions (Addendum)
Great to see you. I will call you with your lab results from today and you can view them online.    To figure out dietary calcium: 300 mg/day from all non dairy foods plus 300 mg per cup of milk, other dairy, or fortified juice.  Non dairy foods that contain calcium:  Kale, oranges, sardines, oatmeal, soy milk/soybeans, salmon, white beans, dried figs, turnip greens, almonds, broccoli, tofu.

## 2017-11-04 NOTE — Progress Notes (Signed)
Subjective:   Patient ID: Shirley Wang, female    DOB: August 17, 1995, 22 y.o.   MRN: [id number]  Shirley Wang is a pleasant 22 y.o. year old female who presents to clinic today with Annual Exam (Patient is here today for a CPE without PAP.  Last PAP completed 5.17.18 WNL. She is currently fasting.  )  on 11/04/2017  HPI:  Health Maintenance  Topic Date Due  . HIV Screening  02/06/2010  . INFLUENZA VACCINE  01/16/2018  . PAP SMEAR  10/31/2019  . TETANUS/TDAP  10/13/2025    First pap done by me last May.   Does have a PMH significant for ovarian cysts. Please with current OCPs.  Pulmonary valve stenosis s/p valve repair- last saw her cardiologist, Dr. Ace Wang, on 08/23/16.  Note reviewed. Echo done at that OV. Seeing him Wednesday.  Current Outpatient Medications on File Prior to Visit  Medication Sig Dispense Refill  . amoxicillin (AMOXIL) 500 MG capsule Take 4 tablets PO x 1 prior to dental exam 4 capsule 0  . drospirenone-ethinyl estradiol (NIKKI) 3-0.02 MG tablet Take 1 tablet by mouth daily.    . fluticasone (FLONASE) 50 MCG/ACT nasal spray Place 2 sprays into the nose 2 (two) times daily. 16 g 12  . Multiple Vitamins-Minerals (MULTIVITAMIN PO) Take 1 tablet by mouth daily.     No current facility-administered medications on file prior to visit.     Allergies  Allergen Reactions  . Dairy Aid [Lactase]   . Nickel Rash    Past Medical History:  Diagnosis Date  . Allergic rhinitis    has used zyrtec and nasonex in the past prn  . Asthma    occ wheeze with cold and persistent coughs in remote past  . PDA (patent ductus arteriosus)   . Pulmonic stenosis, congenital    Cong Valvar PS, ballooned at 11 months   . Urinary tract infection 1999    Past Surgical History:  Procedure Laterality Date  . CARDIAC SURGERY  11/2015   sinus venosus ASD and PAPVR repair, PDA ligation Shirley Wang CTS at [facility])  . PULMONARY VALVULOPLASTY     [facility]    Family History  Problem Relation Age  of Onset  . Hypertension Father   . Asthma Brother     Social History   Socioeconomic History  . Marital status: Single    Spouse name: Not on file  . Number of children: Not on file  . Years of education: Not on file  . Highest education level: Not on file  Occupational History  . Not on file  Social Needs  . Financial resource strain: Not on file  . Food insecurity:    Worry: Not on file    Inability: Not on file  . Transportation needs:    Medical: Not on file    Non-medical: Not on file  Tobacco Use  . Smoking status: Never Smoker  . Smokeless tobacco: Never Used  Substance and Sexual Activity  . Alcohol use: No  . Drug use: No  . Sexual activity: Not on file  Lifestyle  . Physical activity:    Days per week: Not on file    Minutes per session: Not on file  . Stress: Not on file  Relationships  . Social connections:    Talks on phone: Not on file    Gets together: Not on file    Attends religious service: Not on file    Active member of club or  organization: Not on file    Attends meetings of clubs or organizations: Not on file    Relationship status: Not on file  . Intimate partner violence:    Fear of current or ex partner: Not on file    Emotionally abused: Not on file    Physically abused: Not on file    Forced sexual activity: Not on file  Other Topics Concern  . Not on file  Social History Narrative  . Not on file   The PMH, PSH, Social History, Family History, Medications, and allergies have been reviewed in [facility], and have been updated if relevant.       Review of Systems     Objective:    BP 110/74 (BP Location: Left Arm, Patient Position: Sitting, Cuff Size: Normal)   Pulse 71   Temp 98.6 F (37 C) (Oral)   Ht 5' 5.25" (1.657 m)   Wt 166 lb 6.4 oz (75.5 kg)   LMP 10/29/2017   SpO2 98%   BMI 27.48 kg/m    Physical Exam   General:  Well-developed,well-nourished,in no acute distress; alert,appropriate and cooperative throughout  examination Head:  normocephalic and atraumatic.   Eyes:  vision grossly intact, PERRL Ears:  R ear normal and L ear normal externally, TMs clear bilaterally Nose:  no external deformity.   Mouth:  good dentition.   Neck:  No deformities, masses, or tenderness noted. Breasts:  No mass, nodules, thickening, tenderness, bulging, retraction, inflamation, nipple discharge or skin changes noted.   Lungs:  Normal respiratory effort, chest expands symmetrically. Lungs are clear to auscultation, no crackles or wheezes. Heart:  Normal rate and regular rhythm. S1 and S2 normal without gallop, murmur, click, rub or other extra sounds. Abdomen:  Bowel sounds positive,abdomen soft and non-tender without masses, organomegaly or hernias noted. Msk:  No deformity or scoliosis noted of thoracic or lumbar spine.   Extremities:  No clubbing, cyanosis, edema, or deformity noted with normal full range of motion of all joints.   Neurologic:  alert & oriented X3 and gait normal.   Skin:  Intact without suspicious lesions or rashes Cervical Nodes:  No lymphadenopathy noted Axillary Nodes:  No palpable lymphadenopathy Psych:  Cognition and judgment appear intact. Alert and cooperative with normal attention span and concentration. No apparent delusions, illusions, hallucinations      Assessment & Plan:   Status post cardiac surgery  Well woman exam without gynecological exam No follow-ups on file.

## 2018-02-11 ENCOUNTER — Telehealth: Payer: Self-pay

## 2018-02-11 MED ORDER — DROSPIRENONE-ETHINYL ESTRADIOL 3-0.02 MG PO TABS: 1.0000 | ORAL_TABLET | Freq: Every day | ORAL | 1 refills | Status: DC

## 2018-02-11 MED FILL — LORYNA 3-0.02 MG TABS: 3-0.02 | 84 days supply | Qty: 84 | Fill #0

## 2018-02-11 NOTE — Addendum Note (Signed)
Addended by: Marcell AngerSELF, SARAH E on: 02/11/2018 04:51 PM   Modules accepted: Orders

## 2018-02-11 NOTE — Telephone Encounter (Signed)
Rx sent patient is aware 

## 2018-02-11 NOTE — Telephone Encounter (Signed)
Copied from CRM [phone]#[id number]. Topic: General - Other >> Feb 11, 2018  2:49 PM Percival SpanishKennedy, Cheryl W wrote:  Pt is currently taking drospirenone-ethinyl estradiol (NIKKI) 3-0.02 MG tablet  and now she [facility] and [facility]. Pt is asking if Dr Dayton [facility]Aron [facility] and if so [facility]

## 2018-02-11 NOTE — Telephone Encounter (Signed)
Yes okay to change and send rx to pharmacy as requested.

## 2018-03-24 ENCOUNTER — Ambulatory Visit (INDEPENDENT_AMBULATORY_CARE_PROVIDER_SITE_OTHER): Payer: Self-pay | Admitting: Family Medicine

## 2018-03-24 ENCOUNTER — Encounter: Payer: Self-pay | Admitting: Family Medicine

## 2018-03-24 VITALS — BP 120/72 | HR 72 | Temp 98.8°F | Wt 166.0 lb

## 2018-03-24 DIAGNOSIS — H6983 Other specified disorders of Eustachian tube, bilateral: Secondary | ICD-10-CM

## 2018-03-24 MED ORDER — ONDANSETRON 4 MG PO TBDP: 4.0000 mg | ORAL_TABLET | Freq: Three times a day (TID) | ORAL | 0 refills | Status: DC | PRN

## 2018-03-24 MED ORDER — CETIRIZINE HCL 10 MG PO TABS: 10.0000 mg | ORAL_TABLET | Freq: Every day | ORAL | 0 refills | Status: DC

## 2018-03-24 MED ORDER — AZELASTINE HCL 0.1 % NA SOLN: 1.0000 | Freq: Two times a day (BID) | NASAL | 0 refills | Status: DC

## 2018-03-24 MED ORDER — PREDNISONE 10 MG (21) PO TBPK: ORAL_TABLET | ORAL | 0 refills | Status: DC

## 2018-03-24 NOTE — Patient Instructions (Signed)
Eustachian Tube Dysfunction The eustachian tube connects the middle ear to the back of the nose. It regulates air pressure in the middle ear by allowing air to move between the ear and nose. It also helps to drain fluid from the middle ear space. When the eustachian tube does not function properly, air pressure, fluid, or both can build up in the middle ear. Eustachian tube dysfunction can affect one or both ears. What are the causes? This condition happens when the eustachian tube becomes blocked or cannot open normally. This may result from:  Ear infections.  Colds and other upper respiratory infections.  Allergies.  Irritation, such as from cigarette smoke or acid from the stomach coming up into the esophagus (gastroesophageal reflux).  Sudden changes in air pressure, such as from descending in an airplane.  Abnormal growths in the nose or throat, such as nasal polyps, tumors, or enlarged tissue at the back of the throat (adenoids).  What increases the risk? This condition may be more likely to develop in people who smoke and people who are overweight. Eustachian tube dysfunction may also be more likely to develop in children, especially children who have:  Certain birth defects of the mouth, such as cleft palate.  Large tonsils and adenoids.  What are the signs or symptoms? Symptoms of this condition may include:  A feeling of fullness in the ear.  Ear pain.  Clicking or popping noises in the ear.  Ringing in the ear.  Hearing loss.  Loss of balance.  Symptoms may get worse when the air pressure around you changes, such as when you travel to an area of high elevation or fly on an airplane. How is this diagnosed? This condition may be diagnosed based on:  Your symptoms.  A physical exam of your ear, nose, and throat.  Tests, such as those that measure: ? The movement of your eardrum (tympanogram). ? Your hearing (audiometry).  How is this treated? Treatment  depends on the cause and severity of your condition. If your symptoms are mild, you may be able to relieve your symptoms by moving air into ("popping") your ears. If you have symptoms of fluid in your ears, treatment may include:  Decongestants.  Antihistamines.  Nasal sprays or ear drops that contain medicines that reduce swelling (steroids).  In some cases, you may need to have a procedure to drain the fluid in your eardrum (myringotomy). In this procedure, a small tube is placed in the eardrum to:  Drain the fluid.  Restore the air in the middle ear space.  Follow these instructions at home:  Take over-the-counter and prescription medicines only as told by your health care provider.  Use techniques to help pop your ears as recommended by your health care provider. These may include: ? Chewing gum. ? Yawning. ? Frequent, forceful swallowing. ? Closing your mouth, holding your nose closed, and gently blowing as if you are trying to blow air out of your nose.  Do not do any of the following until your health care provider approves: ? Travel to high altitudes. ? Fly in airplanes. ? Work in a pressurized cabin or room. ? Scuba dive.  Keep your ears dry. Dry your ears completely after showering or bathing.  Do not smoke.  Keep all follow-up visits as told by your health care provider. This is important. Contact a health care provider if:  Your symptoms do not go away after treatment.  Your symptoms come back after treatment.  You are   unable to pop your ears.  You have: ? A fever. ? Pain in your ear. ? Pain in your head or neck. ? Fluid draining from your ear.  Your hearing suddenly changes.  You become very dizzy.  You lose your balance. This information is not intended to replace advice given to you by your health care provider. Make sure you discuss any questions you have with your health care provider. Document Released: 07/01/2015 Document Revised: 11/10/2015  Document Reviewed: 06/23/2014 Elsevier Interactive Patient Education  2018 Elsevier Inc.  

## 2018-03-24 NOTE — Progress Notes (Signed)
Shirley Wang is a 22 y.o. female who presents today with concerns of bilateral ear pain and subsequent dizziness. She reports that she has not experienced any fevers but reports or congestion but did report sinus pressure. She reports two episodes of dizziness one on yesterday after being spun around by her fiancee and then feeling dizzy.  Review of Systems  Constitutional: Negative for chills, fever and malaise/fatigue.  HENT: Positive for ear pain and sinus pain. Negative for congestion, ear discharge and sore throat.   Eyes: Negative.   Respiratory: Negative for cough, sputum production and shortness of breath.   Cardiovascular: Negative.  Negative for chest pain.  Gastrointestinal: Negative for abdominal pain, diarrhea, nausea and vomiting.  Genitourinary: Negative for dysuria, frequency, hematuria and urgency.  Musculoskeletal: Negative for myalgias.  Skin: Negative.   Neurological: Negative for headaches.  Endo/Heme/Allergies: Negative.   Psychiatric/Behavioral: Negative.     O: Vitals:   03/24/18 1311  BP: 120/72  Pulse: 72  Temp: 98.8 F (37.1 C)  SpO2: 98%     Physical Exam  Constitutional: She is oriented to person, place, and time. Vital signs are normal. She appears well-developed and well-nourished. She is active.  Non-toxic appearance. She does not have a sickly appearance.  HENT:  Head: Normocephalic.  Right Ear: Hearing, external ear and ear canal normal. A middle ear effusion is present.  Left Ear: Hearing, external ear and ear canal normal. A middle ear effusion is present.  Nose: Nose normal.  Mouth/Throat: Uvula is midline and oropharynx is clear and moist.  Neck: Normal range of motion. Neck supple.  Cardiovascular: Normal rate, regular rhythm, normal heart sounds and normal pulses.  Pulmonary/Chest: Effort normal and breath sounds normal.  Abdominal: Soft. Bowel sounds are normal.  Musculoskeletal: Normal range of motion.  Lymphadenopathy:       Head  (right side): No submental and no submandibular adenopathy present.       Head (left side): No submental and no submandibular adenopathy present.    She has no cervical adenopathy.  Neurological: She is alert and oriented to person, place, and time.  Psychiatric: She has a normal mood and affect.  Vitals reviewed.  A: 1. Dysfunction of both eustachian tubes    P: Discussed exam findings, diagnosis etiology and medication use and indications reviewed with patient. Follow- Up and discharge instructions provided. No emergent/urgent issues found on exam.  Patient verbalized understanding of information provided and agrees with plan of care (POC), all questions answered.  Reviewed information on how to resolve pain and how medication prescribed will treat condition.  1. Dysfunction of both eustachian tubes - predniSONE (STERAPRED UNI-PAK 21 TAB) 10 MG (21) TBPK tablet; Take 6 tablets on day 1, then 5, 4, 3, 2, 1 until complete. - azelastine (ASTELIN) 0.1 % nasal spray; Place 1 spray into both nostrils 2 (two) times daily. Use in each nostril as directed - cetirizine (ZYRTEC) 10 MG tablet; Take 1 tablet (10 mg total) by mouth daily. - ondansetron (ZOFRAN ODT) 4 MG disintegrating tablet; Take 1 tablet (4 mg total) by mouth every 8 (eight) hours as needed for nausea or vomiting.

## 2018-04-03 ENCOUNTER — Encounter: Payer: Self-pay | Admitting: Family Medicine

## 2018-04-03 ENCOUNTER — Ambulatory Visit (INDEPENDENT_AMBULATORY_CARE_PROVIDER_SITE_OTHER): Payer: Self-pay | Admitting: Family Medicine

## 2018-04-03 VITALS — BP 110/80 | HR 85 | Temp 98.6°F | Wt 168.0 lb

## 2018-04-03 DIAGNOSIS — H65193 Other acute nonsuppurative otitis media, bilateral: Secondary | ICD-10-CM

## 2018-04-03 MED ORDER — AMOXICILLIN-POT CLAVULANATE 875-125 MG PO TABS: 1.0000 | ORAL_TABLET | Freq: Two times a day (BID) | ORAL | 0 refills | Status: AC

## 2018-04-03 MED ORDER — PREDNISONE 20 MG PO TABS: 40.0000 mg | ORAL_TABLET | Freq: Every day | ORAL | 0 refills | Status: DC

## 2018-04-03 NOTE — Progress Notes (Signed)
Shirley Wang is a 22 y.o. female who presents today with concerns of for follow up after being seen about 2 weeks ago. She was treated for eustachian tube dysfunction with oral steroid taper and antihistamine. She reports initial response to therapy/treatment but then 2 days after completing steroid experiencing a reemergence of symptoms. In her first exam on 10/7 she reports that she has not experienced any fevers but reports or congestion but did report sinus pressure. She reports two episodes of dizziness one on yesterday after being spun around by her fiancee and then feeling dizzy.  Review of Systems  Constitutional: Negative for chills, fever and malaise/fatigue.  HENT: Positive for ear pain. Negative for congestion, sinus pain and sore throat.   Eyes: Negative.   Respiratory: Negative for cough, sputum production and shortness of breath.   Cardiovascular: Negative.  Negative for chest pain.  Gastrointestinal: Negative for abdominal pain, diarrhea, nausea and vomiting.  Genitourinary: Negative for dysuria, frequency, hematuria and urgency.  Musculoskeletal: Negative for myalgias.  Skin: Negative.   Neurological: Positive for dizziness. Negative for headaches.  Endo/Heme/Allergies: Negative.   Psychiatric/Behavioral: Negative.     O: Vitals:   04/03/18 0814  BP: 110/80  Pulse: 85  Temp: 98.6 F (37 C)  SpO2: 98%     Physical Exam  Constitutional: She is oriented to person, place, and time. Vital signs are normal. She appears well-developed and well-nourished. She is active.  Non-toxic appearance. She does not have a sickly appearance.  HENT:  Head: Normocephalic.  Right Ear: Hearing, external ear and ear canal normal. There is swelling. A middle ear effusion is present.  Left Ear: Hearing, external ear and ear canal normal. There is swelling. A middle ear effusion is present.  Nose: Nose normal.  Mouth/Throat: Uvula is midline and oropharynx is clear and moist.  Neck: Normal  range of motion. Neck supple.  Cardiovascular: Normal rate, regular rhythm, normal heart sounds and normal pulses.  Pulmonary/Chest: Effort normal and breath sounds normal.  Abdominal: Soft. Bowel sounds are normal.  Musculoskeletal: Normal range of motion.  Lymphadenopathy:       Head (right side): No submental and no submandibular adenopathy present.       Head (left side): No submental and no submandibular adenopathy present.    She has no cervical adenopathy.  Neurological: She is alert and oriented to person, place, and time.  Psychiatric: She has a normal mood and affect.  Vitals reviewed.  A: 1. Acute effusion of both middle ears    P: Discussed exam findings, diagnosis etiology and medication use and indications reviewed with patient. Follow- Up and discharge instructions provided. No emergent/urgent issues found on exam.  Patient verbalized understanding of information provided and agrees with plan of care (POC), all questions answered.  Will repeat steroid at high dose- short course burst and with drug of choice for otitis media- Patient advised if symptoms unimproved to seek further evaluation with PCP for comprehensive eval.  1. Acute effusion of both middle ears - predniSONE (DELTASONE) 20 MG tablet; Take 2 tablets (40 mg total) by mouth daily with breakfast. - amoxicillin-clavulanate (AUGMENTIN) 875-125 MG tablet; Take 1 tablet by mouth 2 (two) times daily for 7 days.

## 2018-04-03 NOTE — Patient Instructions (Signed)
Otitis Media With Effusion, Pediatric Otitis media with effusion (OME) occurs when there is inflammation of the middle ear and fluid in the middle ear space. There are no signs and symptoms of infection. The middle ear space contains air and the bones for hearing. Air in the middle ear space helps to transmit sound to the brain. OME is a common condition in children, and it often occurs after an ear infection. This condition may be present for several weeks or longer after an ear infection. Most cases of this condition get better on their own. What are the causes? OME is caused by a blockage of the eustachian tube in one or both ears. These tubes drain fluid in the ears to the back of the nose (nasopharynx). If the tissue in the tube swells up (edema), the tube closes. This prevents fluid from draining. Blockage can be caused by:  Ear infections.  Colds and other upper respiratory infections.  Allergies.  Irritants, such as tobacco smoke.  Enlarged adenoids. The adenoids are areas of soft tissue located high in the back of the throat, behind the nose and the roof of the mouth. They are part of the body's natural defense (immune) system.  A mass in the nasopharynx.  Damage to the ear caused by pressure changes (barotrauma).  What increases the risk? Your child is more likely to develop this condition if:  He or she has repeated ear and sinus infections.  He or she has allergies.  He or she is exposed to tobacco smoke.  He or she attends daycare.  He or she is not breastfed.  What are the signs or symptoms? Symptoms of this condition may not be obvious. Sometimes this condition does not have any symptoms, or symptoms may overlap with those of a cold or upper respiratory tract illness. Symptoms of this condition include:  Temporary hearing loss.  A feeling of fullness in the ear without pain.  Irritability or agitation.  Balance (vestibular) problems.  As a result of hearing  loss, your child may:  Listen to the TV at a loud volume.  Not respond to questions.  Ask "What?" often when spoken to.  Mistake or confuse one sound or word for another.  Perform poorly at school.  Have a poor attention span.  Become agitated or irritated easily.  How is this diagnosed? This condition is diagnosed with an ear exam. Your child's health care provider will look inside your child's ear with an instrument (otoscope) to check for redness, swelling, and fluid. Other tests may be done, including:  A test to check the movement of the eardrum (pneumatic otoscopy). This is done by squeezing a small amount of air into the ear.  A test that changes air pressure in the middle ear to check how well the eardrum moves and to see if the eustachian tube is working (tympanogram).  Hearing test (audiogram). This test involves playing tones at different pitches to see if your child can hear each tone.  How is this treated? Treatment for this condition depends on the cause. In many cases, the fluid goes away on its own. In some cases, your child may need a procedure to create a hole in the eardrum to allow fluid to drain (myringotomy) and to insert small drainage tubes (tympanostomy tubes) into the eardrums. These tubes help to drain fluid and prevent infection. This procedure may be recommended if:  OME does not get better over several months.  Your child has many ear   infections within several months.  Your child has noticeable hearing loss.  Your child has problems with speech and language development.  Surgery may also be done to remove the adenoids (adenoidectomy). Follow these instructions at home:  Give over-the-counter and prescription medicines only as told by your child's health care provider.  Keep children away from any tobacco smoke.  Keep all follow-up visits as told by your child's health care provider. This is important. How is this prevented?  Keep your  child's vaccinations up to date. Make sure your child gets all recommended vaccinations, including a pneumonia and flu vaccine.  Encourage hand washing. Your child should wash his or her hands often with soap and water. If there is no soap and water, he or she should use hand sanitizer.  Avoid exposing your child to tobacco smoke.  Breastfeed your baby, if possible. Babies who are breastfed as long as possible are less likely to develop this condition. Contact a health care provider if:  Your child's hearing does not get better after 3 months.  Your child's hearing is worse.  Your child has ear pain.  Your child has a fever.  Your child has drainage from the ear.  Your child is dizzy.  Your child has a lump on his or her neck. Get help right away if:  Your child has bleeding from the nose.  Your child cannot move part of her or his face.  Your child has trouble breathing.  Your child cannot smell.  Your child develops severe congestion.  Your child develops weakness.  Your child who is younger than 3 months has a temperature of 100F (38C) or higher. Summary  Otitis media with effusion (OME) occurs when there is inflammation of the middle ear and fluid in the middle ear space.  This condition is caused by blockage of one or both eustachian tubes, which drain fluid in the ears to the back of the nose.  Symptoms of this condition can include temporary hearing loss, a feeling of fullness in the ear, irritability or agitation, and balance (vestibular) problems. Sometimes, there are no symptoms.  This condition is diagnosed with an ear exam and tests, such as pneumatic otoscopy, tympanogram, and audiogram.  Treatment for this condition depends on the cause. In many cases, the fluid goes away on its own. This information is not intended to replace advice given to you by your health care provider. Make sure you discuss any questions you have with your health care  provider. Document Released: 08/25/2003 Document Revised: 04/26/2016 Document Reviewed: 04/26/2016 Elsevier Interactive Patient Education  2017 Elsevier Inc.  

## 2018-05-06 ENCOUNTER — Ambulatory Visit: Payer: Self-pay | Admitting: Family Medicine

## 2018-05-06 MED FILL — LORYNA 3-0.02 MG TABS: 3-0.02 | 84 days supply | Qty: 84 | Fill #1

## 2018-05-07 ENCOUNTER — Encounter: Payer: Self-pay | Admitting: Gastroenterology

## 2018-05-07 ENCOUNTER — Encounter: Payer: Self-pay | Admitting: Family Medicine

## 2018-05-07 ENCOUNTER — Ambulatory Visit: Payer: 59 | Admitting: Family Medicine

## 2018-05-07 VITALS — BP 118/74 | HR 82 | Temp 99.0°F | Ht 65.25 in | Wt 163.0 lb

## 2018-05-07 DIAGNOSIS — R5383 Other fatigue: Secondary | ICD-10-CM

## 2018-05-07 DIAGNOSIS — R197 Diarrhea, unspecified: Secondary | ICD-10-CM | POA: Diagnosis not present

## 2018-05-07 LAB — COMPREHENSIVE METABOLIC PANEL
ALT: 12 U/L (ref 0–35)
AST: 14 U/L (ref 0–37)
Albumin: 4.2 g/dL (ref 3.5–5.2)
Alkaline Phosphatase: 41 U/L (ref 39–117)
BUN: 11 mg/dL (ref 6–23)
CHLORIDE: 104 meq/L (ref 96–112)
CO2: 24 mEq/L (ref 19–32)
Calcium: 9.8 mg/dL (ref 8.4–10.5)
Creatinine, Ser: 0.71 mg/dL (ref 0.40–1.20)
GFR: 108.19 mL/min (ref >60.00)
GLUCOSE: 100 mg/dL — AB (ref 70–99)
POTASSIUM: 4.7 meq/L (ref 3.5–5.1)
SODIUM: 139 meq/L (ref 135–145)
Total Bilirubin: 0.3 mg/dL (ref 0.2–1.2)
Total Protein: 6.8 g/dL (ref 6.0–8.3)

## 2018-05-07 LAB — CBC
HEMATOCRIT: 40.8 % (ref 36.0–46.0)
Hemoglobin: 13.9 g/dL (ref 12.0–15.0)
MCHC: 34.1 g/dL (ref 30.0–36.0)
MCV: 87.1 fl (ref 78.0–100.0)
Platelets: 305 10*3/uL (ref 150.0–400.0)
RBC: 4.69 Mil/uL (ref 3.87–5.11)
RDW: 12.3 % (ref 11.5–15.5)
WBC: 6.3 10*3/uL (ref 4.0–10.5)

## 2018-05-07 LAB — VITAMIN B12: Vitamin B-12: 304 pg/mL (ref 211–911)

## 2018-05-07 LAB — H. PYLORI ANTIBODY, IGG: H Pylori IgG: NEGATIVE

## 2018-05-07 NOTE — Progress Notes (Signed)
Subjective:   Patient ID: Shirley Reddenasey L Wang, female    DOB: 12-Jun-1996, 22 y.o.   MRN: [id number]  Shirley Wang is a pleasant 22 y.o. year old female who presents to clinic today with Diarrhea (Ongoing for three weeks-admits to intermittent abdominal pain-denies a specific area. She has been taking immodium once-with no improvement. Denies nasuea or vomiting. Denies blood in her stools. She has been eating a bland diet, but has diarrhea within 10 minutes of eating. )  on 05/07/2018  HPI:  Diarrhea with abdominal cramping- orginially started in March.  Cut out dairy and symptoms improved.   Abdominal cramping- relieved by defecation.  3 weeks ago, symptoms returned, maybe worse this time.  Now has mucous in her stool.  Denies nausea or vomiting.  She has multiple loose stools per day.  No blood in stool.  She is tried.  Joints do not hurt.  When asked about family history of IBD- she is unsure . Her dad was diagnosed with Crohn's in his 3420s and was on prednisone for years for this and then told years later that he did not have it.  She does have a decreased appetite but she thinks this is because she is afraid to eat.  Every time she eats, the pain returns and she develops loose and mucus stools.   Current Outpatient Medications on File Prior to Visit  Medication Sig Dispense Refill  . drospirenone-ethinyl estradiol (LORYNA) 3-0.02 MG tablet Take 1 tablet by mouth daily. 3 Package 1  . fluticasone (FLONASE) 50 MCG/ACT nasal spray Place 2 sprays into the nose 2 (two) times daily. 16 g 12  . Multiple Vitamins-Minerals (MULTIVITAMIN PO) Take 1 tablet by mouth daily.     No current facility-administered medications on file prior to visit.     Allergies  Allergen Reactions  . Dairy Aid [Lactase]   . Nickel Rash    Past Medical History:  Diagnosis Date  . Allergic rhinitis    has used zyrtec and nasonex in the past prn  . Asthma    occ wheeze with cold and persistent coughs in remote  past  . PDA (patent ductus arteriosus)   . Pulmonic stenosis, congenital    Cong Valvar PS, ballooned at 11 months   . Urinary tract infection 1999    Past Surgical History:  Procedure Laterality Date  . CARDIAC SURGERY  11/2015   sinus venosus ASD and PAPVR repair, PDA ligation [facility]  . PULMONARY VALVULOPLASTY     [facility]    Family History  Problem Relation Age of Onset  . Hypertension Father   . Asthma Brother     Social History   Socioeconomic History  . Marital status: Single    Spouse name: Not on file  . Number of children: Not on file  . Years of education: Not on file  . Highest education level: Not on file  Occupational History  . Not on file  Social Needs  . Financial resource strain: Not on file  . Food insecurity:    Worry: Not on file    Inability: Not on file  . Transportation needs:    Medical: Not on file    Non-medical: Not on file  Tobacco Use  . Smoking status: Never Smoker  . Smokeless tobacco: Never Used  Substance and Sexual Activity  . Alcohol use: No  . Drug use: No  . Sexual activity: Not on file  Lifestyle  . Physical activity:  Days per week: Not on file    Minutes per session: Not on file  . Stress: Not on file  Relationships  . Social connections:    Talks on phone: Not on file    Gets together: Not on file    Attends religious service: Not on file    Active member of club or organization: Not on file    Attends meetings of clubs or organizations: Not on file    Relationship status: Not on file  . Intimate partner violence:    Fear of current or ex partner: Not on file    Emotionally abused: Not on file    Physically abused: Not on file    Forced sexual activity: Not on file  Other Topics Concern  . Not on file  Social History Narrative  . Not on file   The PMH, PSH, Social History, Family History, Medications, and allergies have been reviewed in [facility], and have been updated if relevant.   Review of Systems    Constitutional: Positive for appetite change and fatigue. Negative for fever.  HENT: Negative.   Eyes: Negative.   Respiratory: Negative.   Cardiovascular: Negative.   Gastrointestinal: Positive for abdominal pain and diarrhea. Negative for abdominal distention, anal bleeding, blood in stool, constipation, nausea, rectal pain and vomiting.  Endocrine: Negative.   Genitourinary: Negative.   Musculoskeletal: Negative.   Skin: Negative.   Allergic/Immunologic: Negative.   Neurological: Negative.   Hematological: Negative.   Psychiatric/Behavioral: Negative.   All other systems reviewed and are negative.      Objective:    BP 118/74   Pulse 82   Temp 99 F (37.2 C) (Oral)   Ht 5' 5.25" (1.657 m)   Wt 163 lb (73.9 kg)   SpO2 99%   BMI 26.92 kg/m    Physical Exam  Constitutional: She is oriented to person, place, and time. She appears well-developed and well-nourished. No distress.  HENT:  Head: Normocephalic and atraumatic.  Eyes: EOM are normal.  Neck: Normal range of motion.  Cardiovascular: Normal rate.  Pulmonary/Chest: Effort normal.  Abdominal: Soft. Bowel sounds are normal. She exhibits no distension and no mass. There is no tenderness. There is no rebound and no guarding. No hernia.  Musculoskeletal: Normal range of motion.  Neurological: She is alert and oriented to person, place, and time. No cranial nerve deficit.  Skin: Skin is warm and dry. She is not diaphoretic.  Psychiatric: She has a normal mood and affect. Her behavior is normal. Judgment and thought content normal.  Nursing note and vitals reviewed.         Assessment & Plan:   Diarrhea, unspecified type No follow-ups on file.

## 2018-05-07 NOTE — Assessment & Plan Note (Signed)
Intermittent and now progressing in severity and with new symptoms- mucous in stools. Symptoms are concerning for IBD.  Will check labs today, including celiac panel but I explained to [place] and her mom that I do feel she needs to have a colonoscopy and possible endoscopy to rule out IBD. The patient indicates understanding of these issues and agrees with the plan. Orders Placed This Encounter  Procedures  . Celiac Pnl 2 rflx Endomysial Ab Ttr  . CBC  . Comprehensive metabolic panel  . B12  . H. pylori antibody, IgG  . Ambulatory referral to Gastroenterology

## 2018-05-07 NOTE — Patient Instructions (Addendum)
Great to see you. I will call you with your lab results from today and you can view them online.    We are referring you to GI.

## 2018-05-09 ENCOUNTER — Telehealth: Payer: Self-pay

## 2018-05-09 NOTE — Telephone Encounter (Signed)
Copied from CRM [id number]. Topic: General - Other >> May 09, 2018 10:21 AM Ronney LionArrington, Shykila A wrote: Reason for CRM: pt called in today wanting to know if she can receive a call back regarding her lab results.

## 2018-05-09 NOTE — Telephone Encounter (Signed)
Spoke with patient-informed all of her results have not been completed. Once we receive them she will be notified.

## 2018-05-12 NOTE — Telephone Encounter (Signed)
Patient notified-aware results are not back yet on her Celiac panel. Will be notified when they are complete.

## 2018-05-30 ENCOUNTER — Encounter: Payer: Self-pay | Admitting: Gastroenterology

## 2018-05-30 ENCOUNTER — Other Ambulatory Visit (INDEPENDENT_AMBULATORY_CARE_PROVIDER_SITE_OTHER): Payer: 59

## 2018-05-30 ENCOUNTER — Ambulatory Visit: Payer: 59 | Admitting: Gastroenterology

## 2018-05-30 VITALS — BP 110/72 | HR 91 | Ht 64.25 in | Wt 166.6 lb

## 2018-05-30 DIAGNOSIS — R197 Diarrhea, unspecified: Secondary | ICD-10-CM

## 2018-05-30 DIAGNOSIS — R140 Abdominal distension (gaseous): Secondary | ICD-10-CM | POA: Diagnosis not present

## 2018-05-30 DIAGNOSIS — E538 Deficiency of other specified B group vitamins: Secondary | ICD-10-CM | POA: Diagnosis not present

## 2018-05-30 DIAGNOSIS — R109 Unspecified abdominal pain: Secondary | ICD-10-CM

## 2018-05-30 LAB — HIGH SENSITIVITY CRP: CRP, High Sensitivity: 17.7 mg/L — ABNORMAL HIGH (ref 0.000–5.000)

## 2018-05-30 LAB — SEDIMENTATION RATE: Sed Rate: 15 mm/hr (ref 0–20)

## 2018-05-30 NOTE — Progress Notes (Signed)
Shirley Wang    [id number]    1995/11/10  Primary Care Physician:Aron, Marciano Sequin, MD  Referring Physician: Lucille Passy, MD [place], [place] [address]  Chief complaint:  Diarrhea  HPI: 22 year old female with history of pulmonic stenosis status post open heart surgery with valvuloplasty 2017 here for new patient visit with complaints of diarrhea referred by Dr. Deborra Medina She developed severe diarrhea in March 2019, had multiple episodes of watery stool associated with abdominal cramping that lasted for 2 to 3 days until she took Imodium and Pepto-Bismol.  Diarrhea improved with bland diet and avoiding dairy products.  She currently has 1-2 bowel movements daily. She reintroduce dairy back to her diet last month and had similar episodes of significant diarrhea which lasted for few days until she started taking Imodium.  Denies any blood in stool.  She has severe abdominal cramping which improves after a bowel movement.  Denies any weight loss. No family history of IBD or colon cancer  CBC, CMP within normal limits.  B12 level 304.  TSH level normal.  Celiac panel negative.  H. pylori IgG antibody negative  Outpatient Encounter Medications as of 05/30/2018  Medication Sig  . drospirenone-ethinyl estradiol (LORYNA) 3-0.02 MG tablet Take 1 tablet by mouth daily.  . fluticasone (FLONASE) 50 MCG/ACT nasal spray Place 2 sprays into the nose 2 (two) times daily.  . Multiple Vitamins-Minerals (MULTIVITAMIN PO) Take 1 tablet by mouth daily.   No facility-administered encounter medications on file as of 05/30/2018.     Allergies as of 05/30/2018 - Review Complete 05/30/2018  Allergen Reaction Noted  . Dairy aid [lactase]  11/04/2017  . Nickel Rash 11/30/2015    Past Medical History:  Diagnosis Date  . Allergic rhinitis    has used zyrtec and nasonex in the past prn  . Asthma    occ wheeze with cold and persistent coughs in remote past  . PDA (patent ductus  arteriosus)   . Pulmonic stenosis, congenital    Cong Valvar PS, ballooned at 11 months   . Urinary tract infection 1999    Past Surgical History:  Procedure Laterality Date  . CARDIAC SURGERY  11/2015   sinus venosus ASD and PAPVR repair, PDA ligation [facility]relene Redden C[facility] at [facility])  . PULMONARY VALVULOPLASTY     [facility]    Family History  Problem Relation Age of Onset  . Hypertension Father   . Asthma Brother     Social History   Socioeconomic History  . Marital status: Single    Spouse name: Not on file  . Number of children: Not on file  . Years of education: Not on file  . Highest education level: Not on file  Occupational History  . Not on file  Social Needs  . Financial resource strain: Not on file  . Food insecurity:    Worry: Not on file    Inability: Not on file  . Transportation needs:    Medical: Not on file    Non-medical: Not on file  Tobacco Use  . Smoking status: Never Smoker  . Smokeless tobacco: Never Used  Substance and Sexual Activity  . Alcohol use: No  . Drug use: No  . Sexual activity: Not on file  Lifestyle  . Physical activity:    Days per week: Not on file    Minutes per session: Not on file  . Stress: Not on file  Relationships  .  Social connections:    Talks on phone: Not on file    Gets together: Not on file    Attends religious service: Not on file    Active member of club or organization: Not on file    Attends meetings of clubs or organizations: Not on file    Relationship status: Not on file  . Intimate partner violence:    Fear of current or ex partner: Not on file    Emotionally abused: Not on file    Physically abused: Not on file    Forced sexual activity: Not on file  Other Topics Concern  . Not on file  Social History Narrative  . Not on file      Review of systems: Review of Systems  Constitutional: Negative for fever and chills.  Positive for fatigue HENT: Negative.   Eyes: Negative for blurred vision.  Respiratory:  Negative for cough, shortness of breath and wheezing.   Cardiovascular: Negative for chest pain and palpitations.  Gastrointestinal: as per HPI Genitourinary: Negative for dysuria, urgency, frequency and hematuria.  Musculoskeletal: Negative for myalgias, back pain and joint pain.  Skin: Negative for itching and rash.  Neurological: Negative for dizziness, tremors, focal weakness, seizures and loss of consciousness.  Endo/Heme/Allergies: Positive for seasonal allergies.  Psychiatric/Behavioral: Negative for depression, suicidal ideas and hallucinations.  All other systems reviewed and are negative.   Physical Exam: Vitals:   05/30/18 0921  BP: 110/72  Pulse: 91  SpO2: 99%   Body mass index is 28.37 kg/m. Gen:      No acute distress HEENT:  EOMI, sclera anicteric Neck:     No masses; no thyromegaly Lungs:    Clear to auscultation bilaterally; normal respiratory effort CV:         Regular rate and rhythm; + systolic murmur Abd:      + bowel sounds; soft, non-tender; no palpable masses, no distension Ext:    No edema; adequate peripheral perfusion Skin:      Warm and dry; no rash Neuro: alert and oriented x 3 Psych: normal mood and affect  Data Reviewed:  Reviewed labs, radiology imaging, old records and pertinent past GI work up   Assessment and Plan/Recommendations:  22 year old female with with history of pulmonic stenosis status post valvuloplasty in 2017 complaints of intermittent diarrhea History of lactose intolerance Her symptoms are concerning for possible IBD, we will check CRP, ESR and fecal lactoferrin Check GI pathogen panel to exclude infectious etiology but seems less likely given the chronicity of her symptoms Also check lactulose breath test to exclude small intestinal bacterial overgrowth Continue to follow lactose-free diet B12 level is low normal, start oral vitamin B12 capsule thousand micrograms daily, will recheck in 3 to 6 months, if persistently low  will need to consider B12 injection Return in 3 to 4 weeks or sooner if needed   Greater than 50% of the time used for counseling as well as treatment plan and follow-up. She had multiple questions which were answered to her satisfaction  K. Denzil Magnuson , MD [phone]    CC: Lucille Passy, MD

## 2018-05-30 NOTE — Patient Instructions (Addendum)
Go to the basement for labs today  Start B12 1041m daily  You have been given a testing kit to check for small intestine bacterial overgrowth (SIBO) which is completed by a company named [facility]. Make sure to return your test in the mail using the return mailing label given you along with the kit. Your demographic and insurance information have already been sent to the company and they should be in contact with you over the next week regarding this test. Please keep in mind that you will be getting a call from phone number [phone] a similar number. If you do not hear from them within this time frame, please call our office at [phone]    Lactose-Free Diet, Adult If you have lactose intolerance, you are not able to digest lactose. Lactose is a natural sugar found mainly in milk and milk products. You may need to avoid all foods and beverages that contain lactose. A lactose-free diet can help you do this. What do I need to know about this diet?  Do not consume foods, beverages, vitamins, minerals, or medicines with lactose. Read ingredients lists carefully.  Look for the words "lactose-free" on labels.  Use lactase enzyme drops or tablets as directed by your health care provider.  Use lactose-free milk or a milk alternative, such as soy milk, for drinking and cooking.  Make sure you get enough calcium and vitamin D in your diet. A lactose-free eating plan can be lacking in these important nutrients.  Take calcium and vitamin D supplements as directed by your health care provider. Talk to your provider about supplements if you are not able to get enough calcium and vitamin D from food. Which foods have lactose? Lactose is found in:  Milk and foods made from milk.  Yogurt.  Cheese.  Butter.  Margarine.  Sour cream.  Cream.  Whipped toppings and nondairy creamers.  Ice cream and other milk-based desserts.  Lactose is also found in foods or products made  with milk or milk ingredients. To find out whether a food contains milk or a milk ingredient, look at the ingredients list. Avoid foods with the statement "May contain milk" and foods that contain:  Butter.  Cream.  Milk.  Milk solids.  Milk powder.  Whey.  Curd.  Caseinate.  Lactose.  Lactalbumin.  Lactoglobulin.  What are some alternatives to milk and foods made with milk products?  Lactose-free milk.  Soy milk with added calcium and vitamin D.  Almond, coconut, or rice milk with added calcium and vitamin D. Note that these are low in protein.  Soy products, such as soy yogurt, soy cheese, soy ice cream, and soy-based sour cream. Which foods can I eat? Grains Breads and rolls made without milk, such as [place] or [place], bagels, pita, and mBoston Scientific Corn tortillas, corn meal, grits, and polenta. Crackers without lactose or milk solids, such as soda crackers and graham crackers. Cooked or dry cereals without lactose or milk solids. Pasta, quinoa, couscous, barley, oats, bulgur, farro, rice, wild rice, or other grains prepared without milk or lactose. Plain popcorn. Vegetables Fresh, frozen, and canned vegetables without cheese, cream, or butter sauces. Fruits All fresh, canned, frozen, or dried fruits that are not processed with lactose. Meats and Other Protein Sources Plain beef, chicken, fish, [place] lamb, veal, pork, wild game, or ham. Kosher-prepared meat products. Strained or junior meats that do not contain milk. Eggs. Soy meat substitutes. Beans, lentils, and hummus. Tofu. Nuts and seeds. Peanut  or other nut butters without lactose. Soups, casseroles, and mixed dishes without cheese, cream, or milk. Dairy Lactose-free milk. Soy, rice, or almond milk with added calcium and vitamin D. Soy cheese and yogurt. Beverages Carbonated drinks. Tea. Coffee, freeze-dried coffee, and some instant coffees. Fruit and vegetable juices. Condiments Soy sauce. Carob  powder. Olives. Gravy made with water. Baker's cocoa. Angie Fava. Pure seasonings and spices. Ketchup. Mustard. Bouillon. Broth. Sweets and Desserts Water and fruit ices. Gelatin. Cookies, pies, or cakes made from allowed ingredients, such as angel food cake. Pudding made with water or a milk substitute. Lactose-free tofu desserts. Soy, coconut milk, or rice-milk-based frozen desserts. Sugar. Honey. Jam, jelly, and marmalade. Molasses. Pure sugar candy. Dark chocolate without milk. Marshmallows. Fats and Oils Margarines and salad dressings that do not contain milk. Berniece Salines. Vegetable oils. Shortening. Mayonnaise. Soy or coconut-based cream. The items listed above may not be a complete list of recommended foods or beverages. Contact your dietitian for more options. Which foods are not recommended? Grains Breads and rolls that contain milk. Toaster pastries. Muffins, biscuits, waffles, cornbread, and pancakes. These can be prepared at home, commercial, or from mixes. Sweet rolls, donuts, English muffins, fry bread, lefse, flour tortillas with lactose, or [place] toast made with milk or milk ingredients. Crackers that contain lactose. Corn curls. Cooked or dry cereals with lactose. Vegetables Creamed or breaded vegetables. Vegetables in a cheese or butter sauce or with lactose-containing margarines. Instant potatoes. [place] fries. Scalloped or au gratin potatoes. Fruits None. Meats and Other Protein Sources Scrambled eggs, omelets, and souffles that contain milk. Creamed or breaded meat, fish, chicken, or [place]. Sausage products, such as wieners and liver sausage. Cold cuts that contain milk solids. Cheese, cottage cheese, ricotta cheese, and cheese spreads. Lasagna and macaroni and cheese. Pizza. Peanut or other nut butters with added milk solids. Casseroles or mixed dishes containing milk or cheese. Dairy All dairy products, including milk, goat's milk, buttermilk, kefir, acidophilus milk, flavored milk,  evaporated milk, condensed milk, dulce de [place], eggnog, yogurt, cheese, and cheese spreads. Beverages Hot chocolate. Cocoa with lactose. Instant iced teas. Powdered fruit drinks. Smoothies made with milk or yogurt. Condiments Chewing gum that has lactose. Cocoa that has lactose. Spice blends if they contain milk products. Artificial sweeteners that contain lactose. Nondairy creamers. Sweets and Desserts Ice cream, ice milk, gelato, sherbet, and frozen yogurt. Custard, pudding, and mousse. Cake, cream pies, cookies, and other desserts containing milk, cream, cream cheese, or milk chocolate. Pie crust made with milk-containing margarine or butter. Reduced-calorie desserts made with a sugar substitute that contains lactose. Toffee and butterscotch. Milk, white, or dark chocolate that contains milk. Fudge. Caramel. Fats and Oils Margarines and salad dressings that contain milk or cheese. Cream. Half and half. Cream cheese. Sour cream. Chip dips made with sour cream or yogurt. The items listed above may not be a complete list of foods and beverages to avoid. Contact your dietitian for more information. Am I getting enough calcium? Calcium is found in many foods that contain lactose and is important for bone health. The amount of calcium you need depends on your age:  Adults younger than 50 years: 1000 mg of calcium a day.  Adults older than 50 years: 1200 mg of calcium a day.  If you are not getting enough calcium, other calcium sources include:  Orange juice with calcium added. There are 300-350 mg of calcium in 1 cup of orange juice.  Sardines with edible bones. There are 325 mg of calcium  in 3 oz of sardines.  Calcium-fortified soy milk. There are 300-400 mg of calcium in 1 cup of calcium-fortified soy milk.  Calcium-fortified rice or almond milk. There are 300 mg of calcium in 1 cup of calcium-fortified rice or almond milk.  Canned salmon with edible bones. There are 180 mg of calcium in 3  oz of canned salmon with edible bones.  Calcium-fortified breakfast cereals. There are [phone] mg of calcium in calcium-fortified breakfast cereals.  Tofu set with calcium sulfate. There are 250 mg of calcium in  cup of tofu set with calcium sulfate.  Spinach, cooked. There are 145 mg of calcium in  cup of cooked spinach.  Edamame, cooked. There are 130 mg of calcium in  cup of cooked edamame.  Collard greens, cooked. There are 125 mg of calcium in  cup of cooked collard greens.  Kale, frozen or cooked. There are 90 mg of calcium in  cup of cooked or frozen kale.  Almonds. There are 95 mg of calcium in  cup of almonds.  Broccoli, cooked. There are 60 mg of calcium in 1 cup of cooked broccoli.  This information is not intended to replace advice given to you by your health care provider. Make sure you discuss any questions you have with your health care provider. Document Released: 11/24/2001 Document Revised: 11/10/2015 Document Reviewed: 09/04/2013 Elsevier Interactive Patient Education  2018 [place] you for choosing [facility]  Kavitha Nandigam,MD

## 2018-06-01 ENCOUNTER — Encounter: Payer: Self-pay | Admitting: Gastroenterology

## 2018-06-02 ENCOUNTER — Other Ambulatory Visit: Payer: Self-pay

## 2018-06-02 ENCOUNTER — Telehealth: Payer: Self-pay | Admitting: Gastroenterology

## 2018-06-02 MED ORDER — DIPHENOXYLATE-ATROPINE 2.5-0.025 MG PO TABS: 1.0000 | ORAL_TABLET | Freq: Two times a day (BID) | ORAL | 0 refills | Status: DC | PRN

## 2018-06-02 NOTE — Telephone Encounter (Signed)
Discussed with the mother. [place] [facility] [facility] [place]

## 2018-06-02 NOTE — Telephone Encounter (Signed)
Please send prescription for Lomotil 1 tablet twice daily as needed X 60 tabs.  Await results from all the testing.  CRP is significantly elevated.  If continues to have persistent diarrhea will need to consider colonoscopy to exclude IBD

## 2018-06-02 NOTE — Telephone Encounter (Signed)
Spoke with the patient's mother. The patient works the night shift.  She is eating bland and lactose free diet. Stopped the probiotic. Feeling she is having "more bowel movements since stopping probiotic." She will turn in the stool specimen today. Wants to know if there is anything she can do to help with her frequency of stools until this is "figured out?"

## 2018-06-03 ENCOUNTER — Other Ambulatory Visit: Payer: 59

## 2018-06-03 DIAGNOSIS — R197 Diarrhea, unspecified: Secondary | ICD-10-CM | POA: Diagnosis not present

## 2018-06-06 ENCOUNTER — Telehealth: Payer: Self-pay | Admitting: Gastroenterology

## 2018-06-06 DIAGNOSIS — R140 Abdominal distension (gaseous): Secondary | ICD-10-CM | POA: Diagnosis not present

## 2018-06-06 DIAGNOSIS — R197 Diarrhea, unspecified: Secondary | ICD-10-CM | POA: Diagnosis not present

## 2018-06-06 DIAGNOSIS — R109 Unspecified abdominal pain: Secondary | ICD-10-CM | POA: Diagnosis not present

## 2018-06-06 NOTE — Telephone Encounter (Signed)
Patients mother calling to check status of stool test results done on 12.17.19.

## 2018-06-06 NOTE — Telephone Encounter (Signed)
Advised of fecal lactoferrin test being negative. GI pathogen panel is in progress. Patient can take Imodium now since the stool specimens are turned in.

## 2018-06-13 ENCOUNTER — Other Ambulatory Visit: Payer: Self-pay

## 2018-06-13 ENCOUNTER — Telehealth: Payer: Self-pay | Admitting: Gastroenterology

## 2018-06-13 LAB — GASTROINTESTINAL PATHOGEN PANEL PCR
C. difficile Tox A/B, PCR: UNDETERMINED — AB
Campylobacter, PCR: UNDETERMINED — AB
Cryptosporidium, PCR: UNDETERMINED — AB
E coli (ETEC) LT/ST PCR: UNDETERMINED — AB
E coli (STEC) stx1/stx2, PCR: UNDETERMINED — AB
E coli 0157, PCR: UNDETERMINED — AB
Giardia lamblia, PCR: UNDETERMINED — AB
Norovirus, PCR: UNDETERMINED — AB
Rotavirus A, PCR: UNDETERMINED — AB
SHIGELLA, PCR: UNDETERMINED — AB
Salmonella, PCR: UNDETERMINED — AB

## 2018-06-13 LAB — FECAL LACTOFERRIN, QUANT
Fecal Lactoferrin: NEGATIVE
MICRO NUMBER:: 91508735
SPECIMEN QUALITY:: ADEQUATE

## 2018-06-13 MED ORDER — RIFAXIMIN 550 MG PO TABS: 550.0000 mg | ORAL_TABLET | Freq: Three times a day (TID) | ORAL | 0 refills | Status: AC

## 2018-06-13 NOTE — Telephone Encounter (Signed)
Pt's mother Marylu LundJanet calling inquiring about lab and breath test results.

## 2018-06-13 NOTE — Telephone Encounter (Signed)
Patient mother calling Beth back stating [facility] outpatient pharmacy states medication xifaxan is not covered by insurance.

## 2018-06-13 NOTE — Telephone Encounter (Signed)
PA through Cover My Meds for [employer] [place] insurance.

## 2018-06-13 NOTE — Telephone Encounter (Signed)
See the result note

## 2018-06-16 NOTE — Telephone Encounter (Signed)
No decision yet

## 2018-06-17 ENCOUNTER — Telehealth: Payer: Self-pay | Admitting: Gastroenterology

## 2018-06-17 NOTE — Telephone Encounter (Signed)
Pt calling inquiring status of PA for xifaxan. I let her know that it is still in process. She is requesting if we can follow up again with cover my meds as she stated that she really needs the medicine.

## 2018-06-17 NOTE — Telephone Encounter (Signed)
I checked the Cover My Meds website. There was an additional question on the form. Answered that and re-faxed.

## 2018-06-19 NOTE — Telephone Encounter (Signed)
Pt called following up on status of PA, she is requesting a call once PA is approved.

## 2018-06-19 NOTE — Telephone Encounter (Signed)
Shirley Wang is denied by [employer]. Patient is aware. Any alternative?

## 2018-06-23 ENCOUNTER — Telehealth: Payer: Self-pay | Admitting: Gastroenterology

## 2018-06-23 NOTE — Telephone Encounter (Signed)
We have enough samples in the office to complete pts course of Shirley Wang  Pts mother will pick up tomorrow

## 2018-06-23 NOTE — Telephone Encounter (Signed)
Beth done prior auth and it was denied waiting on Dr Sharol Given response to previous phone note from [place]   We have enough samples here to complete course for patient  Patients mother will pick up tomorrow

## 2018-06-23 NOTE — Telephone Encounter (Signed)
Ok thanks 

## 2018-07-15 ENCOUNTER — Encounter: Payer: Self-pay | Admitting: Gastroenterology

## 2018-07-15 ENCOUNTER — Ambulatory Visit: Payer: 59 | Admitting: Gastroenterology

## 2018-07-15 VITALS — BP 124/70 | HR 76 | Ht 64.25 in | Wt 167.1 lb

## 2018-07-15 DIAGNOSIS — R7982 Elevated C-reactive protein (CRP): Secondary | ICD-10-CM | POA: Diagnosis not present

## 2018-07-15 DIAGNOSIS — K580 Irritable bowel syndrome with diarrhea: Secondary | ICD-10-CM | POA: Diagnosis not present

## 2018-07-15 DIAGNOSIS — K6389 Other specified diseases of intestine: Secondary | ICD-10-CM | POA: Diagnosis not present

## 2018-07-15 DIAGNOSIS — E739 Lactose intolerance, unspecified: Secondary | ICD-10-CM

## 2018-07-15 DIAGNOSIS — E538 Deficiency of other specified B group vitamins: Secondary | ICD-10-CM

## 2018-07-15 NOTE — Progress Notes (Signed)
Shirley ReddenCasey L Wang    [id number]    Feb 03, 1996  Primary Care Physician:Wang, Bryn Gullingalia M, MD  Referring Physician: Dianne Wang, Shirley M, MD [address] [place], [place] [address]  Chief complaint: Lactose intolerance  HPI: 23 year old female with history of pulmonic stenosis status post open heart surgery with valvuloplasty 2017.  Last seen in office May 30, 2018 here for follow-up visit.  Diarrhea, abdominal bloating and discomfort improved significantly after she completed course of Xifaxan.  She is continue to avoid milk and dairy products. She is taking daily B12.  Feels her energy level is much better since she started taking it. Weight is stable. Denies any nausea, vomiting, abdominal pain, melena or bright red blood per rectum     Outpatient Encounter Medications as of 07/15/2018  Medication Sig  . diphenoxylate-atropine (LOMOTIL) 2.5-0.025 MG tablet Take 1 tablet by mouth 2 (two) times daily as needed for up to 60 doses for diarrhea or loose stools.  . drospirenone-ethinyl estradiol (LORYNA) 3-0.02 MG tablet Take 1 tablet by mouth daily.  . fluticasone (FLONASE) 50 MCG/ACT nasal spray Place 2 sprays into the nose 2 (two) times daily.  . Multiple Vitamins-Minerals (MULTIVITAMIN PO) Take 1 tablet by mouth daily.   No facility-administered encounter medications on file as of 07/15/2018.     Allergies as of 07/15/2018 - Review Complete 07/15/2018  Allergen Reaction Noted  . Dairy aid [lactase]  11/04/2017  . Nickel Rash 11/30/2015    Past Medical History:  Diagnosis Date  . Allergic rhinitis    has used zyrtec and nasonex in the past prn  . Asthma    occ wheeze with cold and persistent coughs in remote past  . Heart disease   . PDA (patent ductus arteriosus)   . Pulmonic stenosis, congenital    Cong Valvar PS, ballooned at 11 months   . Urinary tract infection 1999    Past Surgical History:  Procedure Laterality Date  . CARDIAC SURGERY  11/2015   sinus venosus ASD and PAPVR repair, PDA ligation [facility]  . PULMONARY VALVULOPLASTY     [facility]  . WISDOM TOOTH EXTRACTION      Family History  Problem Relation Age of Onset  . Hypertension Father   . Crohn's disease Father   . Diabetes Father        pre  . Asthma Brother     Social History   Socioeconomic History  . Marital status: Single    Spouse name: Not on file  . Number of children: 0  . Years of education: Not on file  . Highest education level: Not on file  Occupational History  . 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 Financial resource strain: Not on file  . Food insecurity:    Worry: Not on file    Inability: Not on file  . Transportation needs:    Medical: Not on file    Non-medical: Not on file  Tobacco Use  . Smoking status: Never Smoker  . Smokeless tobacco: Never Used  Substance and Sexual Activity  . Alcohol use: Yes    Comment: social  . Drug use: No  . Sexual activity: Not on file  Lifestyle  . Physical activity:    Days per week: Not on file    Minutes per session: Not on file  . Stress: Not on file  Relationships  . Social connections:    Talks on phone: Not  on file    Gets together: Not on file    Attends religious service: Not on file    Active member of club or organization: Not on file    Attends meetings of clubs or organizations: Not on file    Relationship status: Not on file  . Intimate partner violence:    Fear of current or ex partner: Not on file    Emotionally abused: Not on file    Physically abused: Not on file    Forced sexual activity: Not on file  Other Topics Concern  . Not on file  Social History Narrative  . Not on file      Review of systems: Review of Systems  Constitutional: Negative for fever and chills.  HENT: Negative.   Eyes: Negative for blurred vision.  Respiratory: Negative for cough, shortness of breath and wheezing.   Cardiovascular: Negative for chest pain and palpitations.    Gastrointestinal: as per HPI Genitourinary: Negative for dysuria, urgency, frequency and hematuria.  Musculoskeletal: Negative for myalgias, back pain and joint pain.  Skin: Negative for itching and rash.  Neurological: Negative for dizziness, tremors, focal weakness, seizures and loss of consciousness.  Endo/Heme/Allergies: Negative for seasonal allergies.  Psychiatric/Behavioral: Negative for depression, suicidal ideas and hallucinations.  All other systems reviewed and are negative.   Physical Exam: Vitals:   07/15/18 0932  BP: 124/70  Pulse: 76   Body mass index is 28.46 kg/Wang. Gen:      No acute distress HEENT:  EOMI, sclera anicteric Neck:     No masses; no thyromegaly Lungs:    Clear to auscultation bilaterally; normal respiratory effort CV:         Regular rate and rhythm; + systolic murmur Abd:      + bowel sounds; soft, non-tender; no palpable masses, no distension Ext:    No edema; adequate peripheral perfusion Skin:      Warm and dry; no rash Neuro: alert and oriented x 3 Psych: normal mood and affect  Data Reviewed:  Reviewed labs, radiology imaging, old records and pertinent past GI work up   Assessment and Plan/Recommendations:  23 year old female with history of pulmonic stenosis status post valvuloplasty with open heart surgery in 2017 Diarrhea, abdominal pain and discomfort significantly improved after course of rifaximin for small intestinal bacterial overgrowth Continue lactose-free diet Avoid high fructose corn syrup, artificial sweeteners and drinks with high sugar content Avoid probiotics CRP was elevated but fecal lactoferrin was negative.  CBC and CMP within normal limits.  Negative for celiac.  No family history of IBD. Given her symptoms have completely resolved, will hold off endoscopic evaluation B12 deficiency: Continue daily oral B12 1000 micrograms daily, recheck in 6 to 12 months.  25 minutes was spent face-to-face with the patient.  Greater than 50% of the time used for counseling as well as treatment plan and follow-up. She had multiple questions which were answered to her satisfaction  K. Scherry Ran , MD [phone]    CC: Shirley Dun, MD

## 2018-07-15 NOTE — Patient Instructions (Signed)
Follow up as needed  If you are age 24 or older, your body mass index should be between 23-30. Your Body mass index is 28.46 kg/m. If this is out of the aforementioned range listed, please consider follow up with your Primary Care Provider.  If you are age 70 or younger, your body mass index should be between 19-25. Your Body mass index is 28.46 kg/m. If this is out of the aformentioned range listed, please consider follow up with your Primary Care Provider.    I appreciate the  opportunity to care for you  Thank You   Marsa Aris , MD

## 2018-07-22 ENCOUNTER — Telehealth: Payer: 59 | Admitting: Physician Assistant

## 2018-07-22 DIAGNOSIS — J111 Influenza due to unidentified influenza virus with other respiratory manifestations: Secondary | ICD-10-CM | POA: Diagnosis not present

## 2018-07-22 DIAGNOSIS — R69 Illness, unspecified: Secondary | ICD-10-CM | POA: Diagnosis not present

## 2018-07-22 MED ORDER — OSELTAMIVIR PHOSPHATE 75 MG PO CAPS: 75.0000 mg | ORAL_CAPSULE | Freq: Two times a day (BID) | ORAL | 0 refills | Status: DC

## 2018-07-22 NOTE — Progress Notes (Signed)

## 2018-07-28 ENCOUNTER — Other Ambulatory Visit: Payer: Self-pay | Admitting: Family Medicine

## 2018-07-29 MED FILL — LORYNA 3-0.02 MG TABS: 3-0.02 | 84 days supply | Qty: 84 | Fill #0

## 2018-09-12 ENCOUNTER — Encounter: Payer: Self-pay | Admitting: Family Medicine

## 2018-10-01 MED FILL — LORYNA 3-0.02 MG TABS: 3-0.02 | 84 days supply | Qty: 84 | Fill #1

## 2018-12-21 DIAGNOSIS — Z793 Long term (current) use of hormonal contraceptives: Secondary | ICD-10-CM | POA: Insufficient documentation

## 2018-12-21 DIAGNOSIS — E538 Deficiency of other specified B group vitamins: Secondary | ICD-10-CM | POA: Insufficient documentation

## 2018-12-21 DIAGNOSIS — Z01419 Encounter for gynecological examination (general) (routine) without abnormal findings: Secondary | ICD-10-CM | POA: Insufficient documentation

## 2018-12-21 NOTE — Progress Notes (Signed)
Subjective:   Patient ID: Shirley Wang, female    DOB: Mar 26, 1996, 23 y.o.   MRN: [id number]  Shirley Wang is a pleasant 23 y.o. year old female who presents to clinic today with Annual Exam (CPE/ needs loryna rx/ wants to discuss dairy allergy)  on 12/22/2018  HPI:  Health Maintenance  Topic Date Due  . HIV Screening  05/08/2019 (Originally 02/06/2010)  . INFLUENZA VACCINE  01/17/2019  . PAP-Cervical Cytology Screening  10/31/2019  . PAP SMEAR-Modifier  10/31/2019  . TETANUS/TDAP  10/13/2025    First pap done by me on 11/01/16 and was normal.   Does have a PMH significant for ovarian cysts. Please with current OCPs.  Pulmonary valve stenosis s/p valve repair- last saw her cardiologist, Dr. Theadore Nan, on 10/2217.  Note reviewed.  She needs a referral to an adult cardiologist now.  Her heart rate does increased sometimes but not often and she never has CP, dizziness or syncope with exertion.  Diarrhea- I saw her on 05/07/18 for diarrhea and originally started in 08/2017. She did have blood in her stool but she did have mucous with flares of pain and multiple stools per day.  She also complained of fatigue, all symptoms which concerned me for IBD. Upon further questioning her mom said that Shirley Wang's dad was diagnosed with chron's in his 89s.  I explained to Shirley Wang and her mom that her symptoms were concerning for IBD. I did check labs , including celiac panel but I explained to [name] and her mom that I do feel she needs to have a colonoscopy and possible endoscopy to rule out IBD. They agreed and I referred her to GI.  CBC, CMP within normal limits.  B12 level 304.  TSH level normal.  Celiac panel negative.  H. pylori IgG antibody negative  She was seen by GI, Dr. Silverio Decamp on 05/30/18- note reivewed.  At that time, Shirley Wang felt symptoms improved when she cut out lactose.      She checked a CRP, ESR, fecal lactoferrin and GI pathogen panel. He also checked a lactulose breath test to exclude small  intestinal bacterial overgrowth Advised to continue to follow lactose-free diet B12 level is low normal, so was told to start oral vitamin B12 capsule thousand micrograms daily, will recheck in 3 to 6 months, if persistently low will need to consider B12 injection.  Glucose breath test positive for elevated methane level ( 25 PPM) suggestive of bacterial overgrowth. Please send Rx for Rifaximin 553m TID X14 days  Lab Results  Component Value Date   VITAMINB12 304 05/07/2018   She saw Dr. NSilverio Decampagain on 07/15/18.  Note reviewed. At that time she stated that the diarrhea, abdominal bloating and discomfort improved significantly after she completed course of Xifaxan.  She also had continued to avoid milk and dairy products. She was taking daily B12 her energy level is much better since she started taking it.  Her assessment and plan was as follows:  "Diarrhea, abdominal pain and discomfort significantly improved after course of rifaximin for small intestinal bacterial overgrowth Continue lactose-free diet Avoid high fructose corn syrup, artificial sweeteners and drinks with high sugar content Avoid probiotics CRP was elevated but fecal lactoferrin was negative.  CBC and CMP within normal limits.  Negative for celiac.  No family history of IBD. Given her symptoms have completely resolved, will hold off endoscopic evaluation B12 deficiency: Continue daily oral B12 1000 micrograms daily, recheck in 6 to 12 months."  [name]  says she feels just fine as long as she doesn't have dairy.  Lactaid doesn't help- she almost feels it makes it worse.  Does want her B12 recheck today. Current Outpatient Medications on File Prior to Visit  Medication Sig Dispense Refill  . diphenoxylate-atropine (LOMOTIL) 2.5-0.025 MG tablet Take 1 tablet by mouth 2 (two) times daily as needed for up to 60 doses for diarrhea or loose stools. 60 tablet 0  . fluticasone (FLONASE) 50 MCG/ACT nasal spray Place 2 sprays into  the nose 2 (two) times daily. 16 g 12  . LORYNA 3-0.02 MG tablet TAKE 1 TABLET BY MOUTH ONCE DAILY 84 tablet 1  . Multiple Vitamins-Minerals (MULTIVITAMIN PO) Take 1 tablet by mouth daily.     No current facility-administered medications on file prior to visit.     Allergies  Allergen Reactions  . Dairy Aid [Lactase]   . Nickel Rash    Past Medical History:  Diagnosis Date  . Allergic rhinitis    has used zyrtec and nasonex in the past prn  . Asthma    occ wheeze with cold and persistent coughs in remote past  . Heart disease   . PDA (patent ductus arteriosus)   . Pulmonic stenosis, congenital    Cong Valvar PS, ballooned at 11 months   . Urinary tract infection 1999    Past Surgical History:  Procedure Laterality Date  . CARDIAC SURGERY  11/2015   sinus venosus ASD and PAPVR repair, PDA ligation [facility])  . PULMONARY VALVULOPLASTY     [facility]  . WISDOM TOOTH EXTRACTION      Family History  Problem Relation Age of Onset  . Hypertension Father   . Crohn's disease Father   . Diabetes Father        pre  . Asthma Brother     Social History   Socioeconomic History  . Marital status: Single    Spouse name: Not on file  . Number of children: 0  . Years of education: Not on file  . Highest education level: Not on file  Occupational History  . Occupation: Therapist, sports  Social Needs  . Financial resource strain: Not on file  . Food insecurity    Worry: Not on file    Inability: Not on file  . Transportation needs    Medical: Not on file    Non-medical: Not on file  Tobacco Use  . Smoking status: Never Smoker  . Smokeless tobacco: Never Used  Substance and Sexual Activity  . Alcohol use: Yes    Comment: social  . Drug use: No  . Sexual activity: Not on file  Lifestyle  . Physical activity    Days per week: Not on file    Minutes per session: Not on file  . Stress: Not on file  Relationships  . Social Herbalist on phone: Not on file    Gets  together: Not on file    Attends religious service: Not on file    Active member of club or organization: Not on file    Attends meetings of clubs or organizations: Not on file    Relationship status: Not on file  . Intimate partner violence    Fear of current or ex partner: Not on file    Emotionally abused: Not on file    Physically abused: Not on file    Forced sexual activity: Not on file  Other Topics Concern  . Not on  file  Social History Narrative  . Not on file   The PMH, PSH, Social History, Family History, Medications, and allergies have been reviewed in [facility], and have been updated if relevant.       Review of Systems  Constitutional: Negative.   HENT: Negative.   Eyes: Negative.   Respiratory: Negative.   Cardiovascular: Negative.  Negative for chest pain, palpitations and leg swelling.  Gastrointestinal: Positive for abdominal pain and diarrhea. Negative for blood in stool.  Endocrine: Negative.   Genitourinary: Negative.   Musculoskeletal: Negative.   Allergic/Immunologic: Negative.   Neurological: Negative.   Hematological: Negative.   Psychiatric/Behavioral: Negative.   All other systems reviewed and are negative.      Objective:    BP 126/74   Pulse 96   Temp 98.5 F (36.9 C) (Oral)   Ht 5' 4.25" (1.632 m)   Wt 165 lb 9.6 oz (75.1 kg)   SpO2 99%   BMI 28.20 kg/m    Physical Exam    General:  Well-developed,well-nourished,in no acute distress; alert,appropriate and cooperative throughout examination Head:  normocephalic and atraumatic.   Eyes:  vision grossly intact, PERRL Ears:  R ear normal and L ear normal externally, TMs clear bilaterally Nose:  no external deformity.   Mouth:  good dentition.   Neck:  No deformities, masses, or tenderness noted. Breasts:  No mass, nodules, thickening, tenderness, bulging, retraction, inflamation, nipple discharge or skin changes noted.   Lungs:  Normal respiratory effort, chest expands symmetrically.  Lungs are clear to auscultation, no crackles or wheezes. Heart:  Normal rate and regular rhythm. S1 and S2 normal without gallop, murmur, click, rub or other extra sounds. Abdomen:  Bowel sounds positive,abdomen soft and non-tender without masses, organomegaly or hernias noted. Rectal:  no external abnormalities.   Genitalia:  Pelvic Exam:        External: normal female genitalia without lesions or masses        Vagina: normal without lesions or masses        Cervix: normal without lesions or masses        Adnexa: normal bimanual exam without masses or fullness        Uterus: normal by palpation        Pap smear: performed Msk:  No deformity or scoliosis noted of thoracic or lumbar spine.   Extremities:  No clubbing, cyanosis, edema, or deformity noted with normal full range of motion of all joints.   Neurologic:  alert & oriented X3 and gait normal.   Skin:  Intact without suspicious lesions or rashes Cervical Nodes:  No lymphadenopathy noted Axillary Nodes:  No palpable lymphadenopathy Psych:  Cognition and judgment appear intact. Alert and cooperative with normal attention span and concentration. No apparent delusions, illusions, hallucinations       Assessment & Plan:   Well woman exam with routine gynecological exam - Plan:  Status post cardiac surgery - Plan:   Irregular periods - Plan:   Vitamin B12 deficiency - Plan: B12,  Diarrhea, unspecified type - Plan: C-reactive protein,   Pulmonary valve stenosis, unspecified etiology - Plan:   Long term (current) use of hormonal contraceptives - Plan: Lipid panel, Comprehensive metabolic panel, CBC, Lipid panel,

## 2018-12-21 NOTE — Assessment & Plan Note (Addendum)
Per GI, likely IBS worsened by bacterial overgrowth. I did suggest she consider a second opinion to get a colonoscopy. She will think about it.

## 2018-12-22 ENCOUNTER — Telehealth: Payer: Self-pay | Admitting: Behavioral Health

## 2018-12-22 ENCOUNTER — Other Ambulatory Visit (HOSPITAL_COMMUNITY)
Admission: RE | Admit: 2018-12-22 | Discharge: 2018-12-22 | Disposition: A | Discharge status: Home / Self Care | Payer: 59 | Source: Ambulatory Visit | Attending: Family Medicine | Admitting: Family Medicine

## 2018-12-22 ENCOUNTER — Ambulatory Visit (INDEPENDENT_AMBULATORY_CARE_PROVIDER_SITE_OTHER): Payer: 59 | Admitting: Family Medicine

## 2018-12-22 ENCOUNTER — Encounter: Payer: Self-pay | Admitting: Family Medicine

## 2018-12-22 VITALS — BP 126/74 | HR 96 | Temp 98.5°F | Ht 64.25 in | Wt 165.6 lb

## 2018-12-22 DIAGNOSIS — N926 Irregular menstruation, unspecified: Secondary | ICD-10-CM

## 2018-12-22 DIAGNOSIS — Z01419 Encounter for gynecological examination (general) (routine) without abnormal findings: Secondary | ICD-10-CM

## 2018-12-22 DIAGNOSIS — Z9889 Other specified postprocedural states: Secondary | ICD-10-CM

## 2018-12-22 DIAGNOSIS — R197 Diarrhea, unspecified: Secondary | ICD-10-CM

## 2018-12-22 DIAGNOSIS — Z793 Long term (current) use of hormonal contraceptives: Secondary | ICD-10-CM | POA: Diagnosis not present

## 2018-12-22 DIAGNOSIS — E538 Deficiency of other specified B group vitamins: Secondary | ICD-10-CM | POA: Diagnosis not present

## 2018-12-22 DIAGNOSIS — I370 Nonrheumatic pulmonary valve stenosis: Secondary | ICD-10-CM | POA: Diagnosis not present

## 2018-12-22 MED ORDER — DROSPIRENONE-ETHINYL ESTRADIOL 3-0.02 MG PO TABS: 1.0000 | ORAL_TABLET | Freq: Every day | ORAL | 1 refills | Status: DC

## 2018-12-22 MED FILL — DROSPIR-ETH ESTRA 3/.02 MG: 3-0.02 | 84 days supply | Qty: 84 | Fill #0

## 2018-12-22 NOTE — Assessment & Plan Note (Signed)
Pleased with current OCPs.  eRx refills sent.  Check lipid panel today.

## 2018-12-22 NOTE — Telephone Encounter (Signed)

## 2018-12-22 NOTE — Assessment & Plan Note (Signed)
Repeat VIt B12 today.

## 2018-12-22 NOTE — Patient Instructions (Signed)
Great to see you. I will call you with your lab results from today and you can view them online.   

## 2018-12-22 NOTE — Assessment & Plan Note (Signed)
Refer to adult cardiology- has every other year echos.

## 2018-12-22 NOTE — Assessment & Plan Note (Addendum)
Reviewed preventive care protocols, scheduled due services, and updated immunizations Discussed nutrition, exercise, diet, and healthy lifestyle.  Pap smear done today.  STD testing declined.

## 2018-12-23 LAB — COMPREHENSIVE METABOLIC PANEL
ALT: 10 U/L (ref 0–35)
AST: 12 U/L (ref 0–37)
Albumin: 4.1 g/dL (ref 3.5–5.2)
Alkaline Phosphatase: 43 U/L (ref 39–117)
BUN: 13 mg/dL (ref 6–23)
CO2: 27 mEq/L (ref 19–32)
Calcium: 9.2 mg/dL (ref 8.4–10.5)
Chloride: 104 mEq/L (ref 96–112)
Creatinine, Ser: 0.73 mg/dL (ref 0.40–1.20)
GFR: 98.05 mL/min (ref >60.00)
Glucose, Bld: 117 mg/dL — ABNORMAL HIGH (ref 70–99)
Potassium: 4.4 mEq/L (ref 3.5–5.1)
Sodium: 140 mEq/L (ref 135–145)
Total Bilirubin: 0.2 mg/dL (ref 0.2–1.2)
Total Protein: 6.7 g/dL (ref 6.0–8.3)

## 2018-12-23 LAB — LIPID PANEL
Cholesterol: 172 mg/dL (ref 0–200)
HDL: 95.8 mg/dL (ref >39.00)
LDL Cholesterol: 58 mg/dL (ref 0–99)
NonHDL: 75.89
Total CHOL/HDL Ratio: 2
Triglycerides: 91 mg/dL (ref 0.0–149.0)
VLDL: 18.2 mg/dL (ref 0.0–40.0)

## 2018-12-23 LAB — CBC
HCT: 40.8 % (ref 36.0–46.0)
Hemoglobin: 13.1 g/dL (ref 12.0–15.0)
MCHC: 32.2 g/dL (ref 30.0–36.0)
MCV: 91.6 fl (ref 78.0–100.0)
Platelets: 250 10*3/uL (ref 150.0–400.0)
RBC: 4.45 Mil/uL (ref 3.87–5.11)
RDW: 13.4 % (ref 11.5–15.5)
WBC: 7.3 10*3/uL (ref 4.0–10.5)

## 2018-12-23 LAB — VITAMIN B12: Vitamin B-12: 567 pg/mL (ref 211–911)

## 2018-12-23 LAB — C-REACTIVE PROTEIN: CRP: 1.4 mg/dL (ref 0.5–20.0)

## 2018-12-23 LAB — FERRITIN: Ferritin: 62.6 ng/mL (ref 10.0–291.0)

## 2018-12-24 ENCOUNTER — Telehealth: Payer: Self-pay | Admitting: Family Medicine

## 2018-12-24 LAB — CYTOLOGY - PAP: Diagnosis: NEGATIVE

## 2018-12-24 NOTE — Telephone Encounter (Signed)
Dr. Aron - please advise 

## 2018-12-24 NOTE — Telephone Encounter (Signed)
Patient called stated she was diagnosed with IBS from her GI doctor, Patient would like PCP or nurse to call back to discuss if she needs to be put on medication   Call back  [phone]

## 2018-12-25 NOTE — Telephone Encounter (Signed)
Can we schedule her for a virtual visit on Tuesday or Thursday?  There are many options and treatments to discuss.

## 2018-12-25 NOTE — Telephone Encounter (Signed)
Left pt Vm to call back to schedule virtual visit

## 2018-12-26 NOTE — Telephone Encounter (Signed)
Sent mychart message

## 2018-12-31 ENCOUNTER — Telehealth (INDEPENDENT_AMBULATORY_CARE_PROVIDER_SITE_OTHER): Payer: 59 | Admitting: Family Medicine

## 2018-12-31 ENCOUNTER — Encounter: Payer: Self-pay | Admitting: Family Medicine

## 2018-12-31 DIAGNOSIS — K580 Irritable bowel syndrome with diarrhea: Secondary | ICD-10-CM | POA: Diagnosis not present

## 2018-12-31 DIAGNOSIS — K589 Irritable bowel syndrome without diarrhea: Secondary | ICD-10-CM | POA: Insufficient documentation

## 2018-12-31 MED ORDER — AMITRIPTYLINE HCL 10 MG PO TABS: 10.0000 mg | ORAL_TABLET | Freq: Every day | ORAL | 1 refills | Status: DC

## 2018-12-31 NOTE — Assessment & Plan Note (Signed)
>  25 minutes spent in face to face time with patient, >50% spent in counselling or coordination of care.  She feels lomotil when she takes it helps with the diarrhea as a prn, abortive therapy medication. We discussed other treatment options- imodium as needed but she does not feel she needs it. She does want to try elavil 10 mg qhs after I explained that is often used as well. I sent her a mychart message (also in AVS) with other advice.  Call or send my chart message prn if these symptoms worsen or fail to improve as anticipated. The patient indicates understanding of these issues and agrees with the plan.

## 2018-12-31 NOTE — Progress Notes (Signed)
Virtual Visit via Video   Due to the COVID-19 pandemic, this visit was completed with telemedicine (audio/video) technology to reduce patient and provider exposure as well as to preserve personal protective equipment.   I connected with Shirley Wang by a video enabled telemedicine application and verified that I am speaking with the correct person using two identifiers. Location patient: Home Location provider: [place] Wang, Office Persons participating in the virtual visit: Shirley Georgia, MD   I discussed the limitations of evaluation and management by telemedicine and the availability of in person appointments. The patient expressed understanding and agreed to proceed.  Care Team   Patient Care Team: Shirley Passy, MD as PCP - General (Family Medicine) Shirley Searing, MD (Pediatrics)  Subjective:   HPI:   Diarrhea- I saw her on 05/07/18 for diarrhea and originally started in 08/2017. She did have blood in her stool but she did have mucous with flares of pain and multiple stools per day.  She also complained of fatigue, all symptoms which concerned me for IBD. Upon further questioning her mom said that Shirley Wang's dad was diagnosed with chron's in his 39s.  I explained to Shirley Wang and her mom that her symptoms were concerning for IBD. I did check labs , including celiac panel but I explained to Shirley Wang and her mom that I do feel she needs to have a colonoscopy and possible endoscopy to rule out IBD. They agreed and I referred her to Shirley Wang.  CBC, CMP within normal limits. B12 level 304. TSH level normal. Celiac panel negative. H. pylori IgG antibody negative  She was seen by Shirley Wang, Shirley Wang on 05/30/18- note reivewed.  At that time, Shirley Wang felt symptoms improved when she cut out lactose.      She checked a CRP, ESR, fecal lactoferrin and Shirley Wang pathogen panel. He also checked a lactulose breath test to exclude small intestinal bacterial overgrowth Advised to continue to follow  lactose-free diet B12 level is low normal,so was told to start oral vitamin B12 capsule thousand micrograms daily, will recheck in 3 to 6 months, if persistently low will need to consider B12 injection.  Glucose breath test positive for elevated methane level ( 25 PPM) suggestive of bacterial overgrowth so she was given a course of Rifaximin 561m TID X14 days which helped tremendously.  When I last saw her on 12/22/18, I advised her to touch base with her Shirley Wang doc as I did feel she likely needed a colonoscopy.   Patient then called on 12/24/18  And stated that she was diagnosed with IBS from her Shirley Wang doctor, Patient would like PCP or nurse to call back to discuss if she needs to be put on medication.  Having diarrhea almost daily without blood but with mucous.                                                                                                                                                                                                                         Review of Systems  Constitutional: Negative.   HENT: Negative.   Respiratory: Negative.   Cardiovascular: Negative.   Gastrointestinal: Positive for abdominal pain and diarrhea. Negative for blood in stool, constipation, melena, nausea and vomiting.  Genitourinary: Negative.   Musculoskeletal: Negative.   Skin: Negative.   Neurological: Negative.   Endo/Heme/Allergies: Negative.   Psychiatric/Behavioral: Negative.   All other systems reviewed and are negative.    Patient Active Problem List   Diagnosis Date Noted  . IBS (irritable bowel syndrome) 12/31/2018  . Well woman exam with routine gynecological exam 12/21/2018  . Vitamin B12 deficiency 12/21/2018  . Long term  (current) use of hormonal contraceptives 12/21/2018  . Diarrhea 05/07/2018  . Irregular periods 10/30/2016  . Status post cardiac surgery 12/27/2015  . 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 Pulmonic stenosis 07/09/2011    Social History   Tobacco Use  . Smoking status: Never Smoker  . Smokeless tobacco: Never Used  Substance Use Topics  . Alcohol use: Yes    Comment: social    Current Outpatient Medications:  .  amitriptyline (ELAVIL) 10 MG tablet, Take 1 tablet (10 mg total) by mouth at bedtime., Disp: 30 tablet, Rfl: 1 .  diphenoxylate-atropine (LOMOTIL) 2.5-0.025 MG tablet, Take 1 tablet by mouth 2 (two) times daily as needed for up to 60 doses for diarrhea or loose stools., Disp: 60 tablet, Rfl: 0 .  drospirenone-ethinyl estradiol (LORYNA) 3-0.02 MG tablet, Take 1 tablet by mouth daily., Disp: 84 tablet, Rfl: 1 .  fluticasone (FLONASE) 50 MCG/ACT nasal spray, Place 2 sprays into the nose 2 (two) times daily., Disp: 16 g, Rfl: 12 .  Multiple Vitamins-Minerals (MULTIVITAMIN PO), Take 1 tablet by mouth daily., Disp: , Rfl:   Allergies  Allergen Reactions  . Dairy Aid [Lactase]   . Nickel Rash    Objective:   VITALS: Per patient if applicable, see vitals. GENERAL: Alert, appears well and in no acute distress. HEENT: Atraumatic, conjunctiva clear, no obvious abnormalities on inspection of external nose and ears. NECK: Normal movements of the head and neck. CARDIOPULMONARY: No increased WOB. Speaking in clear sentences. I:E ratio WNL.  MS: Moves all visible extremities without noticeable abnormality.  PSYCH: Pleasant and cooperative, well-groomed. Speech normal rate and rhythm. Affect is appropriate. Insight and judgement are appropriate. Attention is focused, linear, and appropriate.  NEURO: CN grossly intact. Oriented as arrived to appointment on time with no prompting. Moves both UE equally.  SKIN: No obvious lesions, wounds, erythema, or cyanosis noted on face or hands.   Depression screen PHQ 2/9 11/04/2017  Decreased Interest 0  Down, Depressed, Hopeless 0  PHQ - 2 Score 0    Assessment and Plan:   [name] was seen today for follow-up.  Diagnoses and all orders for this visit:  Irritable bowel syndrome with diarrhea  Other orders -     amitriptyline (ELAVIL) 10 MG tablet; Take 1 tablet (10 mg total) by mouth at bedtime.    Marland Kitchen COVID-19 Education: The signs and symptoms of COVID-19 were discussed with the patient and how to seek care for testing if needed. The importance of social distancing was discussed today. . Reviewed expectations re: course of current medical issues. . Discussed self-management of symptoms. . Outlined signs and symptoms indicating need for more acute intervention. . Patient verbalized understanding and all questions were answered. Marland Kitchen Health Maintenance issues including appropriate healthy diet, exercise, and smoking avoidance were discussed with patient. . See orders for this visit as documented in the electronic medical record.  Arnette Norris, MD  Records requested if needed. Time spent: 25 minutes, of which >50% was spent in obtaining information about her symptoms, reviewing her previous labs, evaluations, and treatments, counseling her about her condition (please see the discussed topics above), and developing a plan to further investigate it; she had a number of questions which I addressed.

## 2018-12-31 NOTE — Patient Instructions (Signed)
Try minimizing stress and foods that worsen symptoms.   Increase SOLUBLE fiber (from food or supplements (psyllium, etc)-These absorb water and may help either constipation or diarrhea.  Try over the counter gas-x (four times a day when necessary) or beano for bloating.  Peppermint oil can sometimes help with symptoms as well.  Common culprits are hard-to-digest carbs called "FODMAPs"...fermentable, oligo-, di-, and mono-saccharides and polyols...in some fruits, veggies, milk, grains, etc.  Exclude gas producing foods (beans, onions, celery, carrots, raisins, bananas, apricots, prunes, brussel sprouts, wheat germ, pretzels)   Consider trail of lactose free diet (back off milk)   Trial of Align  for bloating/IBS symptoms (OTC).

## 2019-02-10 ENCOUNTER — Other Ambulatory Visit: Payer: Self-pay | Admitting: Family Medicine

## 2019-02-10 ENCOUNTER — Encounter: Payer: Self-pay | Admitting: Family Medicine

## 2019-02-10 MED ORDER — AMITRIPTYLINE HCL 10 MG PO TABS: 10.0000 mg | ORAL_TABLET | Freq: Every day | ORAL | 1 refills | Status: DC

## 2019-02-10 MED FILL — AMITRIPTYLINE HCL 10 MG TAB: 10 | 90 days supply | Qty: 90 | Fill #0

## 2019-02-25 ENCOUNTER — Ambulatory Visit: Payer: 59 | Admitting: Cardiology

## 2019-03-05 ENCOUNTER — Other Ambulatory Visit: Payer: Self-pay | Admitting: Family Medicine

## 2019-03-05 NOTE — Telephone Encounter (Signed)
Pt has remaining refills at [facility] long outpatient. Pt does not need prescription

## 2019-03-08 ENCOUNTER — Encounter: Payer: Self-pay | Admitting: Family Medicine

## 2019-03-10 ENCOUNTER — Other Ambulatory Visit: Payer: Self-pay

## 2019-03-10 ENCOUNTER — Ambulatory Visit (INDEPENDENT_AMBULATORY_CARE_PROVIDER_SITE_OTHER): Payer: 59 | Admitting: Family Medicine

## 2019-03-10 ENCOUNTER — Encounter: Payer: Self-pay | Admitting: Family Medicine

## 2019-03-10 DIAGNOSIS — L237 Allergic contact dermatitis due to plants, except food: Secondary | ICD-10-CM | POA: Insufficient documentation

## 2019-03-10 MED ORDER — PREDNISONE 20 MG PO TABS: ORAL_TABLET | ORAL | 0 refills | Status: AC

## 2019-03-10 NOTE — Assessment & Plan Note (Signed)
Given diffuse nature of rash, will prescribe oral prednisone taper. Meds ordered this encounter  Medications  . predniSONE (DELTASONE) 20 MG tablet    Sig: Take 3 tablets (60 mg total) by mouth daily with breakfast for 3 days, THEN 2 tablets (40 mg total) daily with breakfast for 3 days, THEN 1 tablet (20 mg total) daily with breakfast for 3 days, THEN 0.5 tablets (10 mg total) daily with breakfast for 3 days.    Dispense:  20 tablet    Refill:  0    Call or send my chart message prn if these symptoms worsen or fail to improve as anticipated.  Poison oak dermatitis is redness and soreness (inflammation) of the skin caused by chemicals in the leaves of the poison oak plant. You may have very bad itching, swelling, a rash, and blisters. What are the causes? You may get this condition by:  Touching a poison oak plant.  Touching something that has the chemical from the leaves on it. This may include animals or objects that have come in contact with the plant. What increases the risk? You are more likely to get this condition if you:  Go outdoors often in wooded or marshy areas.  Go outdoors without wearing protective clothing, such as closed shoes, long pants, and a long-sleeved shirt. What are the signs or symptoms? Symptoms of this condition include:  Redness of the skin.  Very bad itching.  A rash that often includes bumps and blisters. ? The rash usually appears 48 hours after exposure if you have been exposed before. ? If this is the first time you have been exposed, the rash may not appear until a week after exposure.  Swelling. This may occur if the reaction is very bad. Symptoms often clear up in 1-2 weeks. The first time you develop this condition, symptoms may last 3-4 weeks. How is this treated? This condition may be treated with:  Hydrocortisone creams or calamine lotions to help with itching.  Oatmeal baths to soothe the skin.  Medicines to help reduce itching  (antihistamines). If you have a very bad reaction, you may also be given steroid medicines. Follow these instructions at home: Medicines  Take or apply over-the-counter and prescription medicines only as told by your doctor.  Use hydrocortisone creams or calamine lotion as needed to help with itching. General instructions  Do not scratch or rub your skin.  Put a cold, wet cloth (cold compress) on the affected areas or take baths in cool water. This will help with itching.  Avoid hot baths and showers.  Take oatmeal baths as needed. Use colloidal oatmeal. You can get this at a pharmacy or grocery store. Follow the instructions on the package.  While you have the rash, wash your clothes right after you wear them.  Keep all follow-up visits as told by your doctor. This is important. How is this prevented?   Know what poison oak looks like so you can avoid it. ? This plant has three leaves with flowering branches on a single stem. ? The leaves are fuzzy. ? The edges of the leaves look like teeth.  If you have touched poison oak, wash your skin with soap and water right away. Be sure to wash under your fingernails.  When hiking or camping, wear long pants, a long-sleeved shirt, tall socks, and hiking boots. You can also use a lotion on your skin that helps to prevent contact with the chemical on the plant.  If you  think that your clothes or outdoor gear came in contact with poison oak, rinse them off with a garden hose before you bring them inside your house.  When doing yard work or gardening, wear gloves, long sleeves, long pants, and boots. Wash your garden tools and gloves if they come in contact with poison oak.  If you think that your pet has come into contact with poison oak, wash him or her with pet shampoo and water. Make sure you wear gloves while washing your pet.  Do not burn poison oak plants. This can release the chemical from the plant into the air and may cause a  reaction. Contact a doctor if:  You have open sores in the rash area.  You have more redness, swelling, or pain in the affected area.  You have redness that spreads beyond the rash area.  You have fluid, blood, or pus coming from the affected area.  You have a fever.  You have a rash over a large area of your body.  You have a rash on your eyes, mouth, or genitals.  Your rash does not improve after a few weeks. Get help right away if:  Your face swells or your eyes swell shut.  You have trouble breathing.  You have trouble swallowing. These symptoms may be an emergency. Do not wait to see if the symptoms will go away. Get medical help right away. Call your local emergency services (911 in the [place] Do not drive yourself to the hospital. Summary  Poison oak dermatitis is redness and soreness of the skin caused by chemicals in the leaves of the poison oak plant.  Symptoms of this condition include redness, very bad itching, a rash, and swelling.  Do not scratch or rub your skin.  Take or apply over-the-counter and prescription medicines only as told by your

## 2019-03-10 NOTE — Progress Notes (Signed)
Virtual Visit via Video   Due to the COVID-19 pandemic, this visit was completed with telemedicine (audio/video) technology to reduce patient and provider exposure as well as to preserve personal protective equipment.   I connected with Shirley Wang by a video enabled telemedicine application and verified that I am speaking with the correct person using two identifiers. Location patient: Home Location provider: [facility], Office Persons participating in the virtual visit: Lonn Georgia, MD   I discussed the limitations of evaluation and management by telemedicine and the availability of in person appointments. The patient expressed understanding and agreed to proceed.  Care Team   Patient Care Team: Lucille Passy, MD as PCP - General (Family Medicine) Ermalene Searing, MD (Pediatrics)  Subjective:   HPI:   Pt sent the following message:  Hey Dr. Deborra Medina,  My husband and I went golfing a week and a half ago and he got poison oak on his arm. He hugged me and now I have it all around my abdomen and back and now on the top of my thighs.  Do I need to make a virtual visit appointment or can you prescribe a prednisone taper for me?  Thank you so much, Shirley Wang  Now it has spread to legs.  They do not have any pets.  They think they have washed everything they came in contact with but they will re wash everything again.  Tried triamcinolone cream but it did not do much.  Review of Systems  Constitutional: Negative.   HENT: Negative.   Eyes: Negative.   Respiratory: Negative.   Cardiovascular: Negative.   Gastrointestinal: Negative.   Genitourinary: Negative.   Musculoskeletal: Negative.   Skin: Positive for itching and rash.  All other systems reviewed and are negative.    Patient Active Problem List   Diagnosis Date Noted  . IBS (irritable bowel syndrome) 12/31/2018  . Well woman exam with routine gynecological exam 12/21/2018  . Vitamin B12 deficiency  12/21/2018  . Long term (current) use of hormonal contraceptives 12/21/2018  . Diarrhea 05/07/2018  . Irregular periods 10/30/2016  . Status post cardiac surgery 12/27/2015  . 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 Pulmonic stenosis 07/09/2011    Social History   Tobacco Use  . Smoking status: Never Smoker  . Smokeless tobacco: Never Used  Substance Use Topics  . Alcohol use: Yes    Comment: social    Current Outpatient Medications:  .  amitriptyline (ELAVIL) 10 MG tablet, Take 1 tablet (10 mg total) by mouth at bedtime., Disp: 90 tablet, Rfl: 1 .  diphenoxylate-atropine (LOMOTIL) 2.5-0.025 MG tablet, Take 1 tablet by mouth 2 (two) times daily as needed for up to 60 doses for diarrhea or loose stools., Disp: 60 tablet, Rfl: 0 .  drospirenone-ethinyl estradiol (LORYNA) 3-0.02 MG tablet, Take 1 tablet by mouth daily., Disp: 84 tablet, Rfl: 1 .  fluticasone (FLONASE) 50 MCG/ACT nasal spray, Place 2 sprays into the nose 2 (two) times daily., Disp: 16 g, Rfl: 12 .  Multiple Vitamins-Minerals (MULTIVITAMIN PO), Take 1 tablet by mouth daily., Disp: , Rfl:   Allergies  Allergen Reactions  . Dairy Aid [Lactase]   . Oxycodone-Acetaminophen Nausea And Vomiting  . Nickel Rash    Objective:  .LMP 02/17/2019   VITALS: Per patient if applicable, see vitals. GENERAL: Alert, appears well and in no acute distress. HEENT: Atraumatic, conjunctiva clear, no obvious abnormalities on inspection of external nose and ears. NECK: Normal movements of  the head and neck. CARDIOPULMONARY: No increased WOB. Speaking in clear sentences. I:E ratio WNL.  MS: Moves all visible extremities without noticeable abnormality. PSYCH: Pleasant and cooperative, well-groomed. Speech normal rate and rhythm. Affect is appropriate. Insight and judgement are appropriate. Attention is focused, linear, and appropriate.  NEURO: CN grossly intact. Oriented as arrived to appointment on time with no prompting. Moves both UE  equally.  SKIN: No obvious lesions, wounds, erythema, or cyanosis noted on face or hands.  Depression screen [facility] 2/9 03/10/2019 11/04/2017  Decreased Interest 0 0  Down, Depressed, Hopeless 0 0  PHQ - 2 Score 0 0    Assessment and Plan:   There are no diagnoses linked to this encounter.  Marland Kitchen COVID-19 Education: The signs and symptoms of COVID-19 were discussed with the patient and how to seek care for testing if needed. The importance of social distancing was discussed today. . Reviewed expectations re: course of current medical issues. . Discussed self-management of symptoms. . Outlined signs and symptoms indicating need for more acute intervention. . Patient verbalized understanding and all questions were answered. Marland Kitchen Health Maintenance issues including appropriate healthy diet, exercise, and smoking avoidance were discussed with patient. . See orders for this visit as documented in the electronic medical record.  Ruthe Mannan, MD  Records requested if needed. Time spent: 15 minutes, of which >50% was spent in obtaining information about her symptoms, reviewing her previous labs, evaluations, and treatments, counseling her about her condition (please see the discussed topics above), and developing a plan to further investigate it; she had a number of questions which I addressed.

## 2019-03-12 ENCOUNTER — Ambulatory Visit: Payer: 59 | Admitting: Cardiology

## 2019-03-16 MED FILL — DROSPIR-ETH ESTRA 3/.02 MG: 3-0.02 | 84 days supply | Qty: 84 | Fill #1

## 2019-05-05 ENCOUNTER — Ambulatory Visit
Admission: RE | Admit: 2019-05-05 | Discharge: 2019-05-05 | Disposition: A | Discharge status: Home / Self Care | Payer: 59 | Source: Ambulatory Visit | Attending: Emergency Medicine | Admitting: Emergency Medicine

## 2019-05-05 ENCOUNTER — Other Ambulatory Visit: Payer: Self-pay

## 2019-05-05 ENCOUNTER — Encounter: Payer: Self-pay | Admitting: Emergency Medicine

## 2019-05-05 ENCOUNTER — Ambulatory Visit
Admission: EM | Admit: 2019-05-05 | Discharge: 2019-05-05 | Disposition: A | Discharge status: Home / Self Care | Payer: 59 | Attending: Emergency Medicine | Admitting: Emergency Medicine

## 2019-05-05 DIAGNOSIS — M25571 Pain in right ankle and joints of right foot: Secondary | ICD-10-CM | POA: Diagnosis not present

## 2019-05-05 DIAGNOSIS — S99911A Unspecified injury of right ankle, initial encounter: Secondary | ICD-10-CM | POA: Diagnosis not present

## 2019-05-05 MED ORDER — IBUPROFEN 800 MG PO TABS: 800.0000 mg | ORAL_TABLET | Freq: Three times a day (TID) | ORAL | 0 refills | Status: DC | PRN

## 2019-05-05 NOTE — ED Provider Notes (Signed)
Shirley Wang    CSN: [id number] Arrival date & time: 05/05/19  1049      History   Chief Complaint Chief Complaint  Patient presents with  . Ankle Pain    HPI Shirley Wang is a 23 y.o. female.   Patient presents with pain in her right ankle after "rolling it" when stepping off of a ladder last week.  She reports ongoing pain and swelling which is getting worse.  She has attempted treatment at home by wearing an ankle splint.  She denies numbness, paresthesias, weakness.  LMP: 04/14/2019.  The history is provided by the patient.    Past Medical History:  Diagnosis Date  . Allergic rhinitis    has used zyrtec and nasonex in the past prn  . Asthma    occ wheeze with cold and persistent coughs in remote past  . Heart disease   . PDA (patent ductus arteriosus)   . Pulmonic stenosis, congenital    Cong Valvar PS, ballooned at 11 months   . Urinary tract infection 1999    Patient Active Problem List   Diagnosis Date Noted  . 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 IBS (irritable bowel syndrome) 12/31/2018  . Well woman exam with routine gynecological exam 12/21/2018  . Vitamin B12 deficiency 12/21/2018  . Long term (current) use of hormonal contraceptives 12/21/2018  . Diarrhea 05/07/2018  . Irregular periods 10/30/2016  . Status post cardiac surgery 12/27/2015  . 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 Pulmonic stenosis 07/09/2011    Past Surgical History:  Procedure Laterality Date  . CARDIAC SURGERY  11/2015   sinus venosus ASD and PAPVR repair, PDA ligation [facility]villa [facility]  . PULMONARY VALVULOPLASTY     [facility]  . WISDOM TOOTH EXTRACTION      OB History   No obstetric history on file.      Home Medications    Prior to Admission medications   Medication Sig Start Date End Date Taking? Authorizing Provider  amitriptyline (ELAVIL) 10 MG tablet Take 1 tablet (10 mg total) by mouth at bedtime. 02/10/19   Dianne DunAron, Talia M, MD   diphenoxylate-atropine (LOMOTIL) 2.5-0.025 MG tablet Take 1 tablet by mouth 2 (two) times daily as needed for up to 60 doses for diarrhea or loose stools. 06/02/18   Napoleon FormNandigam, Kavitha V, MD  drospirenone-ethinyl estradiol (LORYNA) 3-0.02 MG tablet Take 1 tablet by mouth daily. 12/22/18   Dianne DunAron, Talia M, MD  fluticasone (FLONASE) 50 MCG/ACT nasal spray Place 2 sprays into the nose 2 (two) times daily. 03/22/12   Preston FleetingHooker, James B, MD  ibuprofen (ADVIL) 800 MG tablet Take 1 tablet (800 mg total) by mouth every 8 (eight) hours as needed. 05/05/19   Mickie Bailate, Vishruth Seoane H, NP  Multiple Vitamins-Minerals (MULTIVITAMIN PO) Take 1 tablet by mouth daily.    [provider]    Family History Family History  Problem Relation Age of Onset  . Hypertension Father   . Crohn's disease Father   . Diabetes Father        pre  . Asthma Brother     Social History Social History   Tobacco Use  . Smoking status: Never Smoker  . Smokeless tobacco: Never Used  Substance Use Topics  . Alcohol use: Yes    Comment: social  . Drug use: No     Allergies   Dairy aid [lactase], Oxycodone-acetaminophen, and Nickel   Review of Systems Review of Systems  Constitutional: Negative for chills and  fever.  HENT: Negative for ear pain and sore throat.   Eyes: Negative for pain and visual disturbance.  Respiratory: Negative for cough and shortness of breath.   Cardiovascular: Negative for chest pain and palpitations.  Gastrointestinal: Negative for abdominal pain and vomiting.  Genitourinary: Negative for dysuria and hematuria.  Musculoskeletal: Positive for arthralgias. Negative for back pain.  Skin: Negative for color change and rash.  Neurological: Negative for seizures, syncope, weakness and numbness.  All other systems reviewed and are negative.    Physical Exam Triage Vital Signs ED Triage Vitals  Enc Vitals Group     BP      Pulse      Resp      Temp      Temp src      SpO2      Weight       Height      Head Circumference      Peak Flow      Pain Score      Pain Loc      Pain Edu?      Excl. in GC?    No data found.  Updated Vital Signs BP 130/85 (BP Location: Left Arm)   Pulse 84   Temp 98.3 F (36.8 C) (Oral)   Resp 18   Wt 165 lb (74.8 kg)   LMP 04/14/2019   SpO2 98%   BMI 28.10 kg/m   Visual Acuity Right Eye Distance:   Left Eye Distance:   Bilateral Distance:    Right Eye Near:   Left Eye Near:    Bilateral Near:     Physical Exam Vitals signs and nursing note reviewed.  Constitutional:      General: She is not in acute distress.    Appearance: She is well-developed.  HENT:     Head: Normocephalic and atraumatic.     Mouth/Throat:     Mouth: Mucous membranes are moist.  Eyes:     Conjunctiva/sclera: Conjunctivae normal.  Neck:     Musculoskeletal: Neck supple.  Cardiovascular:     Rate and Rhythm: Normal rate and regular rhythm.     Heart sounds: No murmur.  Pulmonary:     Effort: Pulmonary effort is normal. No respiratory distress.     Breath sounds: Normal breath sounds.  Abdominal:     General: Bowel sounds are normal.     Palpations: Abdomen is soft.     Tenderness: There is no abdominal tenderness. There is no guarding or rebound.  Musculoskeletal:        General: Swelling and tenderness present.       Feet:     Comments: Right ankle mildly edematous, tender to palpation (see diagram).  ROM limited by discomfort.    Skin:    General: Skin is warm and dry.     Capillary Refill: Capillary refill takes less than 2 seconds.     Findings: No bruising, erythema, lesion or rash.  Neurological:     General: No focal deficit present.     Mental Status: She is alert and oriented to person, place, and time.     Sensory: No sensory deficit.     Motor: No weakness.  Psychiatric:        Mood and Affect: Mood normal.        Behavior: Behavior normal.      UC Treatments / Results  Labs (all labs ordered are listed, but only abnormal  results are displayed) Labs Reviewed -  No data to display  EKG   Radiology No results found.  Procedures Procedures (including critical care time)  Medications Ordered in UC Medications - No data to display  Initial Impression / Assessment and Plan / UC Course  I have reviewed the triage vital signs and the nursing notes.  Pertinent labs & imaging results that were available during my care of the patient were reviewed by me and considered in my medical decision making (see chart for details).    Right ankle pain.  X-ray negative.  Treating with ibuprofen, rest, elevation, ice, continued use of the ankle splint.  Instructed patient to follow-up with an orthopedist if her symptoms continue or worsen or she develops new symptoms such as numbness, tingling, weakness.  Patient agrees to plan of care.     Final Clinical Impressions(s) / UC Diagnoses   Final diagnoses:  Pain in joint involving right ankle and foot     Discharge Instructions     Go to [facility] for your ankle xray.  Take the ibuprofen as prescribed.  Rest and elevate your ankle.  Apply ice packs 2-3 times a day for up to 20 minutes each.  Wear the ankle splint as needed for comfort.    Follow up with an orthopedist if you symptoms continue or worsen;  Or if you develop new symptoms, such as numbness, tingling, or weakness.         ED Prescriptions    Medication Sig Dispense Auth. Provider   ibuprofen (ADVIL) 800 MG tablet Take 1 tablet (800 mg total) by mouth every 8 (eight) hours as needed. 21 tablet Sharion Balloon, NP     PDMP not reviewed this encounter.   Sharion Balloon, NP 05/05/19 1251

## 2019-05-05 NOTE — ED Triage Notes (Signed)
Patient in office today c/o right ankle pain. Per patient was helping bldg shed and on ladder stepped down and twisted right ankle x 1wk ago w/swelling  OTC:IBU

## 2019-05-05 NOTE — Discharge Instructions (Signed)
Go to [facility] for your ankle xray.  Take the ibuprofen as prescribed.  Rest and elevate your ankle.  Apply ice packs 2-3 times a day for up to 20 minutes each.  Wear the ankle splint as needed for comfort.    Follow up with an orthopedist if you symptoms continue or worsen;  Or if you develop new symptoms, such as numbness, tingling, or weakness.

## 2019-05-30 MED FILL — AMITRIPTYLINE HCL 10 MG TAB: 10 | 90 days supply | Qty: 90 | Fill #1

## 2019-06-05 ENCOUNTER — Encounter: Payer: Self-pay | Admitting: Family Medicine

## 2019-06-06 ENCOUNTER — Other Ambulatory Visit: Payer: Self-pay | Admitting: Family Medicine

## 2019-06-08 MED FILL — DROSPIR-ETH ESTRA 3/.02 MG: 3-0.02 | 84 days supply | Qty: 84 | Fill #0

## 2019-06-08 NOTE — Telephone Encounter (Signed)
Last OV 03/10/19 Last fill 12/22/18  #[id number]/1

## 2019-07-07 ENCOUNTER — Other Ambulatory Visit: Payer: Self-pay | Admitting: Family Medicine

## 2019-07-07 ENCOUNTER — Encounter: Payer: Self-pay | Admitting: Family Medicine

## 2019-07-07 MED ORDER — PREDNISONE 20 MG PO TABS: ORAL_TABLET | ORAL | 0 refills | Status: AC

## 2019-07-17 ENCOUNTER — Encounter: Payer: Self-pay | Admitting: Family Medicine

## 2019-08-18 ENCOUNTER — Ambulatory Visit: Payer: 59 | Admitting: Cardiology

## 2019-08-25 ENCOUNTER — Encounter: Payer: Self-pay | Admitting: Cardiology

## 2019-08-25 ENCOUNTER — Other Ambulatory Visit: Payer: Self-pay

## 2019-08-25 ENCOUNTER — Ambulatory Visit (INDEPENDENT_AMBULATORY_CARE_PROVIDER_SITE_OTHER): Payer: 59 | Admitting: Cardiology

## 2019-08-25 VITALS — BP 120/82 | HR 98 | Temp 97.9°F | Ht 64.0 in | Wt 178.0 lb

## 2019-08-25 DIAGNOSIS — Z8679 Personal history of other diseases of the circulatory system: Secondary | ICD-10-CM

## 2019-08-25 DIAGNOSIS — Q249 Congenital malformation of heart, unspecified: Secondary | ICD-10-CM | POA: Diagnosis not present

## 2019-08-25 DIAGNOSIS — Z9889 Other specified postprocedural states: Secondary | ICD-10-CM

## 2019-08-25 DIAGNOSIS — Z8774 Personal history of (corrected) congenital malformations of heart and circulatory system: Secondary | ICD-10-CM | POA: Diagnosis not present

## 2019-08-25 NOTE — Progress Notes (Signed)
Cardiology Office Note:    Date:  08/25/2019   ID:  Luvenia Redden, DOB November 17, 1995, MRN [id number]  PCP:  Dianne Dun, MD  Cardiologist:  Jodelle Red, MD  Referring MD: Dianne Dun, MD   CC: new patient consultation to establish care with adult cardiology given history of complex congenital heart diseae  History of Present Illness:    Shirley Wang is a 24 y.o. female with a hx of congential heart diseae who is seen as a new consult at the request of Dianne Dun, MD for the evaluation and management of pulmonic stenosis.  [facility] notes reviewed from Care Everywhere: Echo 07/12/2013: Tiny PDA, normal left side. RVOT peak gradient 15 mmHg, mean 8 mmHg. Mild PI. Normal RV size/function. Trivial PS cMRI 06/02/15: Qp:Qs 2.3, small PDA, equivocal PAPVR (right upper lobe draining to SVC). Moderate RV dilation, minimal PS/PR. 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 Findings are consistent with PAPVR from RUL and RML to SVC with Qp/Qs ~ 2. PDA is very small, not hemodynamically significant. Plan: Will discuss with group re: elective surgical repair. Post op echo 11/2015: 1. S/p atrial septal defect patch closure. 2. S/p baffle right pulmonary veins to left atrium. 3. Normal left ventricular systolic function. 4. Pulmonary venous return not well visualized. Echo 11/06/2017: Summary:  1. S/p partial anomalous pulmonary venous connection repair.  2. 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  3. No evidence of pulmonary vein stenosis; anomalous right upper pulmonary vein baffle to left atrium widely patent.  4. Inferior vena cava right-sided.  5. Normal superior vena cava flow profile.  6. Normal main and branch pulmonary arteries.  7. Normal-size left atrium.  8. Normal-size right atrium.  9. Normal left ventricular cavity size and systolic function.    10. No patent ductus arteriosus.  11. No pericardial effusion.  12. Normal coronary arteries.  13. Normal aorta.  14. Normal aortic valve.  15. Normal, right-sided superior vena cava.  16. Intact interventricular septum.  17. No pericardial effusion.  18. No pulmonary valve stenosis.  19. Normal pulmonary valve.  20. Normal right ventricular cavity size and systolic function.  21. Trivial pulmonary valve insufficiency.  Cardiac history: Balloon pulmonary valvuloplasty as an infant (~48 mos old). Was followed every 2 years with echo. In 2017, she noted that her symptoms were daily headaches and fatigue, both worse with activity. Had cardiac workup as above. In 11/2015 had open heart surgery with PDA repair, PAPVR repair, and sinus venosus ASD repair at [facility] with Dr. Simonne Come.  Since her surgery in 2017, she has felt much better. No further headaches or fatigue. 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   She does not have significant family history of heart disease beyond a great aunt with an MI later in life. No other known congenital heart disease.  Denies chest pain, shortness of breath at rest or with normal exertion. No PND, orthopnea, LE edema or unexpected weight gain. No syncope or palpitations.  Past Medical History:  Diagnosis Date  . Allergic rhinitis    has used zyrtec and nasonex in the past prn  . Asthma    occ wheeze with cold and persistent coughs in remote past  . Heart disease   . PDA (patent ductus arteriosus)   . Pulmonic stenosis, congenital  Cong Valvar PS, ballooned at 11 months   . Urinary tract infection 1999    Past Surgical History:  Procedure Laterality Date  . CARDIAC SURGERY  11/2015   sinus venosus ASD and PAPVR repair, PDA ligation [facility] C[facility] at [facility])  . PULMONARY VALVULOPLASTY     [facility]  . WISDOM TOOTH EXTRACTION      Current Medications: Current Outpatient Medications on File Prior to Visit  Medication Sig  .  amitriptyline (ELAVIL) 10 MG tablet Take 1 tablet (10 mg total) by mouth at bedtime.  . diphenoxylate-atropine (LOMOTIL) 2.5-0.025 MG tablet Take 1 tablet by mouth 2 (two) times daily as needed for up to 60 doses for diarrhea or loose stools.  . drospirenone-ethinyl estradiol (YAZ) 3-0.02 MG tablet TAKE 1 TABLET BY MOUTH DAILY.  . fluticasone (FLONASE) 50 MCG/ACT nasal spray Place 2 sprays into the nose 2 (two) times daily.  Marland Kitchen ibuprofen (ADVIL) 800 MG tablet Take 1 tablet (800 mg total) by mouth every 8 (eight) hours as needed.  . Multiple Vitamins-Minerals (MULTIVITAMIN PO) Take 1 tablet by mouth daily.   No current facility-administered medications on file prior to visit.     Allergies:   Dairy aid [lactase], Oxycodone-acetaminophen, and Nickel   Social History   Tobacco Use  . Smoking status: Never Smoker  . Smokeless tobacco: Never Used  Substance Use Topics  . Alcohol use: Yes    Comment: social  . Drug use: No    Family History: family history includes Asthma in her brother; Crohn's disease in her father; Diabetes in her father; Hypertension in her father.  ROS:   Please see the history of present illness.  Additional pertinent ROS: Constitutional: Negative for chills, fever, night sweats, unintentional weight loss  HENT: Negative for ear pain and hearing loss.   Eyes: Negative for loss of vision and eye pain.  Respiratory: Negative for cough, sputum, wheezing.   Cardiovascular: See HPI. Gastrointestinal: Negative for abdominal pain, melena, and hematochezia.  Genitourinary: Negative for dysuria and hematuria.  Musculoskeletal: Negative for falls and myalgias.  Skin: Negative for itching and rash.  Neurological: Negative for focal weakness, focal sensory changes and loss of consciousness.  Endo/Heme/Allergies: Does not bruise/bleed easily.     EKGs/Labs/Other Studies Reviewed:    The following studies were reviewed today: See [facility] reports in HPI  EKG:  EKG is  personally reviewed.  The ekg ordered today demonstrates SR with PAC  Recent Labs: 12/22/2018: ALT 10; BUN 13; Creatinine, Ser 0.73; Hemoglobin 13.1; Platelets 250.0; Potassium 4.4; Sodium 140  Recent Lipid Panel    Component Value Date/Time   CHOL 172 12/22/2018 1413   TRIG 91.0 12/22/2018 1413   HDL 95.80 12/22/2018 1413   CHOLHDL 2 12/22/2018 1413   VLDL 18.2 12/22/2018 1413   LDLCALC 58 12/22/2018 1413    Physical Exam:    VS:  BP 120/82   Pulse 98   Temp 97.9 F (36.6 C)   Ht 5\' 4"  (1.626 m)   Wt 178 lb (80.7 kg)   SpO2 98%   BMI 30.55 kg/m     Wt Readings from Last 3 Encounters:  08/25/19 178 lb (80.7 kg)  05/05/19 165 lb (74.8 kg)  12/22/18 165 lb 9.6 oz (75.1 kg)    GEN: Well nourished, well developed in no acute distress HEENT: Normal, moist mucous membranes NECK: No JVD CARDIAC: regular rhythm, normal S1 and S2, no rubs or gallops. 2/6 SEM on right sternal border VASCULAR: Radial and DP  pulses 2+ bilaterally. No carotid bruits RESPIRATORY:  Clear to auscultation without rales, wheezing or rhonchi  ABDOMEN: Soft, non-tender, non-distended MUSCULOSKELETAL:  Ambulates independently SKIN: Warm and dry, no edema NEUROLOGIC:  Alert and oriented x 3. No focal neuro deficits noted. PSYCHIATRIC:  Normal affect    ASSESSMENT:    1. Congenital heart disease   2. Personal history of pulmonic valve stenosis   3. 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 History of repair of congenital atrial septal defect (ASD)   5. S/P repair of PDA (patent ductus arteriosus)   6. 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 Has been followed with echo every 2-3 years, last in 2019.  -reviewed red flag warning signs that need  immediate medical attention as well as gradual progressive symptoms that require call to the office.  -we discussed future pregnancy. No contraindication to this from a cardiac perspective. I did recommend we make sure she has had a recent echo prior to pregnancy, and I would monitor her during her pregnancy for symptoms  Plan for follow up: 1 year with echo prior. Likely every 2 years after this if stable.  Jodelle Red, MD, PhD [place]  [facility]    Medication Adjustments/Labs and Tests Ordered: Current medicines are reviewed at length with the patient today.  Concerns regarding medicines are outlined above.  Orders Placed This Encounter  Procedures  . EKG 12-Lead  . ECHOCARDIOGRAM COMPLETE   No orders of the defined types were placed in this encounter.   Patient Instructions  Medication Instructions:  Your Physician recommend you continue on your current medication as directed.    *If you need a refill on your cardiac medications before your next appointment, please call your pharmacy*   Lab Work: None   Testing/Procedures: Your physician has requested that you have an echocardiogram. Echocardiography is a painless test that uses sound waves to create images of your heart. It provides your doctor with information about the size and shape of your heart and how well your heart's chambers and valves are working. This procedure takes approximately one hour. There are no restrictions for this procedure. [address]    Follow-Up: At [facility], you and your health needs are our priority.  As part of our continuing mission to provide you with exceptional heart care, we have created designated Provider Care Teams.  These Care Teams include your primary Cardiologist (physician) and Advanced Practice Providers (APPs -  Physician Assistants and Nurse Practitioners) who all work together to provide you with the care you need, when you need  it.  We recommend signing up for the patient portal called "MyChart".  Sign up information is provided on this After Visit Summary.  MyChart is used to connect with patients for Virtual Visits (Telemedicine).  Patients are able to view lab/test results, encounter notes, upcoming appointments, etc.  Non-urgent messages can be sent to your provider as well.   To learn more about what you can do with MyChart, go to [email or web address].    Your next appointment:   1 year(s)  The format for your next appointment:   In Person  Provider:   Jodelle Red, MD      Signed, Jodelle Red, MD  PhD 08/25/2019 7:08 PM    [place]

## 2019-08-25 NOTE — Patient Instructions (Addendum)
Medication Instructions:  Your Physician recommend you continue on your current medication as directed.    *If you need a refill on your cardiac medications before your next appointment, please call your pharmacy*   Lab Work: None   Testing/Procedures: Your physician has requested that you have an echocardiogram. Echocardiography is a painless test that uses sound waves to create images of your heart. It provides your doctor with information about the size and shape of your heart and how well your heart's chambers and valves are working. This procedure takes approximately one hour. There are no restrictions for this procedure. [address]    Follow-Up: At [facility], you and your health needs are our priority.  As part of our continuing mission to provide you with exceptional heart care, we have created designated Provider Care Teams.  These Care Teams include your primary Cardiologist (physician) and Advanced Practice Providers (APPs -  Physician Assistants and Nurse Practitioners) who all work together to provide you with the care you need, when you need it.  We recommend signing up for the patient portal called "MyChart".  Sign up information is provided on this After Visit Summary.  MyChart is used to connect with patients for Virtual Visits (Telemedicine).  Patients are able to view lab/test results, encounter notes, upcoming appointments, etc.  Non-urgent messages can be sent to your provider as well.   To learn more about what you can do with MyChart, go to [email or web address].    Your next appointment:   1 year(s)  The format for your next appointment:   In Person  Provider:   Bridgette Christopher, MD    

## 2019-08-31 MED FILL — JASMIEL 3-0.02 MG TABS: 3-0.02 | 84 days supply | Qty: 84 | Fill #1

## 2019-09-07 ENCOUNTER — Other Ambulatory Visit: Payer: Self-pay

## 2019-09-07 ENCOUNTER — Ambulatory Visit (INDEPENDENT_AMBULATORY_CARE_PROVIDER_SITE_OTHER): Payer: 59 | Admitting: Family Medicine

## 2019-09-07 ENCOUNTER — Encounter: Payer: Self-pay | Admitting: Family Medicine

## 2019-09-07 DIAGNOSIS — Q249 Congenital malformation of heart, unspecified: Secondary | ICD-10-CM

## 2019-09-07 DIAGNOSIS — K580 Irritable bowel syndrome with diarrhea: Secondary | ICD-10-CM

## 2019-09-07 DIAGNOSIS — Z793 Long term (current) use of hormonal contraceptives: Secondary | ICD-10-CM | POA: Diagnosis not present

## 2019-09-07 MED ORDER — AMITRIPTYLINE HCL 10 MG PO TABS: 10.0000 mg | ORAL_TABLET | Freq: Every day | ORAL | 1 refills | Status: DC

## 2019-09-07 MED ORDER — DROSPIRENONE-ETHINYL ESTRADIOL 3-0.02 MG PO TABS: 1.0000 | ORAL_TABLET | Freq: Every day | ORAL | 1 refills | Status: DC

## 2019-09-07 MED ORDER — DIPHENOXYLATE-ATROPINE 2.5-0.025 MG PO TABS: 1.0000 | ORAL_TABLET | Freq: Two times a day (BID) | ORAL | 0 refills | Status: DC | PRN

## 2019-09-07 MED FILL — AMITRIPTYLINE HCL 10 MG TAB: 10 | 90 days supply | Qty: 90 | Fill #0

## 2019-09-07 MED FILL — DIPHENOXYLATE-ATROPINE 2.5-: 2.5-0.025 | 30 days supply | Qty: 60 | Fill #0

## 2019-09-07 NOTE — Assessment & Plan Note (Signed)
Controlled on amitriptyline. Rare use of lomotil

## 2019-09-07 NOTE — Progress Notes (Signed)
    I connected with Shirley Wang on 09/07/19 at 12:20 PM EDT by video and verified that I am speaking with the correct person using two identifiers.   I discussed the limitations, risks, security and privacy concerns of performing an evaluation and management service by video and the availability of in person appointments. I also discussed with the patient that there may be a patient responsible charge related to this service. The patient expressed understanding and agreed to proceed.  Patient location: Home Provider Location: [place] Participants: Lynnda Child and Shirley Wang   Subjective:     Shirley Wang is a 24 y.o. 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    Physical Exam Constitutional:      Appearance: Normal appearance. She is not ill-appearing.  HENT:     Head: Normocephalic and atraumatic.     Right Ear: External ear normal.     Left Ear: External ear normal.  Eyes:     Conjunctiva/sclera: Conjunctivae normal.  Pulmonary:     Effort:  Pulmonary effort is normal. No respiratory distress.  Neurological:     Mental Status: She is alert. Mental status is at baseline.  Psychiatric:        Mood and Affect: Mood normal.        Behavior: Behavior normal.        Thought Content: Thought content normal.        Judgment: Judgment normal.         Assessment & Plan:   Problem List Items Addressed This Visit      Cardiovascular and Mediastinum   Congenital heart disease    Followed by Dr. Cristal Deer. So far stable since surgery in 2017. Ok for pregnancy if desired, but will need to follow with cardiology.         Digestive   IBS (irritable bowel syndrome) - Primary    Controlled on amitriptyline. Rare use of lomotil       Relevant Medications   amitriptyline (ELAVIL) 10 MG tablet   diphenoxylate-atropine (LOMOTIL) 2.5-0.025 MG tablet     Other   Long term (current) use of hormonal contraceptives    Helping to control irregular periods. Continue yaz.       Relevant Medications   drospirenone-ethinyl estradiol (YAZ) 3-0.02 MG tablet       Return in about 1 year (around 09/06/2020).  Lynnda Child, MD

## 2019-09-07 NOTE — Assessment & Plan Note (Signed)
Followed by Dr. Cristal Deer. So far stable since surgery in 2017. Ok for pregnancy if desired, but will need to follow with cardiology.

## 2019-09-07 NOTE — Assessment & Plan Note (Signed)
Helping to control irregular periods. Continue yaz.

## 2019-09-10 ENCOUNTER — Ambulatory Visit (HOSPITAL_COMMUNITY): Payer: 59 | Attending: Cardiology

## 2019-09-10 ENCOUNTER — Other Ambulatory Visit: Payer: Self-pay

## 2019-09-10 DIAGNOSIS — Q249 Congenital malformation of heart, unspecified: Secondary | ICD-10-CM | POA: Diagnosis not present

## 2019-09-14 ENCOUNTER — Ambulatory Visit: Payer: 59 | Admitting: Family Medicine

## 2019-09-17 ENCOUNTER — Ambulatory Visit: Payer: 59 | Attending: Family

## 2019-09-17 DIAGNOSIS — Z23 Encounter for immunization: Secondary | ICD-10-CM

## 2019-09-17 NOTE — Progress Notes (Signed)
   Covid-19 Vaccination Clinic  Name:  JACQULYN BARRESI    MRN: [id number] DOB: [birth year]  09/17/2019  Ms. Rennis Harding was observed post Covid-19 immunization for 15 minutes without incident. She was provided with Vaccine Information Sheet and instruction to access the V-Safe system.   Ms. Rennis Harding was instructed to call 911 with any severe reactions post vaccine: Marland Kitchen Difficulty breathing  . Swelling of face and throat  . A fast heartbeat  . A bad rash all over body  . Dizziness and weakness   Immunizations Administered    Name Date Dose VIS Date Route   Moderna COVID-19 Vaccine 09/17/2019  2:59 PM 0.5 mL 05/19/2019 Intramuscular   Manufacturer: Moderna   Lot: [id number]   NDC: 21031-281-18

## 2019-10-15 ENCOUNTER — Ambulatory Visit: Payer: 59 | Attending: Family

## 2019-10-15 DIAGNOSIS — Z23 Encounter for immunization: Secondary | ICD-10-CM

## 2019-10-15 NOTE — Progress Notes (Signed)
   Covid-19 Vaccination Clinic  Name:  Shirley Wang    MRN: [id number] DOB: [birth year]  10/15/2019  [name] was observed post Covid-19 immunization for 15 minutes without incident. She was provided with Vaccine Information Sheet and instruction to access the V-Safe system.   [name] was instructed to call 911 with any severe reactions post vaccine: Marland Kitchen Difficulty breathing  . Swelling of face and throat  . A fast heartbeat  . A bad rash all over body  . Dizziness and weakness   Immunizations Administered    Name Date Dose VIS Date Route   Moderna COVID-19 Vaccine 10/15/2019  1:52 PM 0.5 mL 05/2019 Intramuscular   Manufacturer: Moderna   Lot: [id number]   NDC: 65207-619-15

## 2019-10-18 ENCOUNTER — Encounter: Payer: Self-pay | Admitting: Family Medicine

## 2019-10-19 NOTE — Telephone Encounter (Signed)
Vaccine dates already updated in the chart. Please review.

## 2019-10-20 ENCOUNTER — Ambulatory Visit: Payer: 59

## 2019-11-18 ENCOUNTER — Encounter: Payer: 59 | Admitting: Family Medicine

## 2019-11-24 ENCOUNTER — Encounter: Payer: Self-pay | Admitting: Family Medicine

## 2019-11-24 ENCOUNTER — Other Ambulatory Visit: Payer: Self-pay

## 2019-11-24 ENCOUNTER — Ambulatory Visit (INDEPENDENT_AMBULATORY_CARE_PROVIDER_SITE_OTHER): Payer: 59 | Admitting: Family Medicine

## 2019-11-24 VITALS — BP 94/70 | HR 85 | Temp 97.7°F | Ht 64.5 in | Wt 181.5 lb

## 2019-11-24 DIAGNOSIS — E538 Deficiency of other specified B group vitamins: Secondary | ICD-10-CM | POA: Diagnosis not present

## 2019-11-24 DIAGNOSIS — K580 Irritable bowel syndrome with diarrhea: Secondary | ICD-10-CM

## 2019-11-24 DIAGNOSIS — Z0000 Encounter for general adult medical examination without abnormal findings: Secondary | ICD-10-CM

## 2019-11-24 DIAGNOSIS — Z1159 Encounter for screening for other viral diseases: Secondary | ICD-10-CM

## 2019-11-24 DIAGNOSIS — R739 Hyperglycemia, unspecified: Secondary | ICD-10-CM

## 2019-11-24 DIAGNOSIS — Z793 Long term (current) use of hormonal contraceptives: Secondary | ICD-10-CM

## 2019-11-24 DIAGNOSIS — Z114 Encounter for screening for human immunodeficiency virus [HIV]: Secondary | ICD-10-CM | POA: Diagnosis not present

## 2019-11-24 DIAGNOSIS — E669 Obesity, unspecified: Secondary | ICD-10-CM | POA: Diagnosis not present

## 2019-11-24 LAB — COMPREHENSIVE METABOLIC PANEL WITH GFR
ALT: 13 U/L (ref 0–35)
AST: 18 U/L (ref 0–37)
Albumin: 4.3 g/dL (ref 3.5–5.2)
Alkaline Phosphatase: 44 U/L (ref 39–117)
BUN: 13 mg/dL (ref 6–23)
CO2: 28 meq/L (ref 19–32)
Calcium: 9.9 mg/dL (ref 8.4–10.5)
Chloride: 101 meq/L (ref 96–112)
Creatinine, Ser: 0.75 mg/dL (ref 0.40–1.20)
GFR: 94.31 mL/min (ref >60.00)
Glucose, Bld: 92 mg/dL (ref 70–99)
Potassium: 4.1 meq/L (ref 3.5–5.1)
Sodium: 137 meq/L (ref 135–145)
Total Bilirubin: 0.2 mg/dL (ref 0.2–1.2)
Total Protein: 7.2 g/dL (ref 6.0–8.3)

## 2019-11-24 LAB — LIPID PANEL
Cholesterol: 175 mg/dL (ref 0–200)
HDL: 82.2 mg/dL (ref >39.00)
LDL Cholesterol: 66 mg/dL (ref 0–99)
NonHDL: 93.26
Total CHOL/HDL Ratio: 2
Triglycerides: 136 mg/dL (ref 0.0–149.0)
VLDL: 27.2 mg/dL (ref 0.0–40.0)

## 2019-11-24 LAB — CBC
HCT: 39.4 % (ref 36.0–46.0)
Hemoglobin: 13.2 g/dL (ref 12.0–15.0)
MCHC: 33.5 g/dL (ref 30.0–36.0)
MCV: 87.3 fl (ref 78.0–100.0)
Platelets: 275 10*3/uL (ref 150.0–400.0)
RBC: 4.51 Mil/uL (ref 3.87–5.11)
RDW: 12.7 % (ref 11.5–15.5)
WBC: 9 10*3/uL (ref 4.0–10.5)

## 2019-11-24 LAB — HEMOGLOBIN A1C: Hgb A1c MFr Bld: 5.6 % (ref 4.6–6.5)

## 2019-11-24 LAB — VITAMIN B12: Vitamin B-12: 804 pg/mL (ref 211–911)

## 2019-11-24 MED ORDER — DIPHENOXYLATE-ATROPINE 2.5-0.025 MG PO TABS: 1.0000 | ORAL_TABLET | Freq: Two times a day (BID) | ORAL | 1 refills | Status: DC | PRN

## 2019-11-24 NOTE — Patient Instructions (Signed)
Preventive Care 21-25 Years Old, Female Preventive care refers to visits with your health care provider and lifestyle choices that can promote health and wellness. This includes:  A yearly physical exam. This may also be called an annual well check.  Regular dental visits and eye exams.  Immunizations.  Screening for certain conditions.  Healthy lifestyle choices, such as eating a healthy diet, getting regular exercise, not using drugs or products that contain nicotine and tobacco, and limiting alcohol use. What can I expect for my preventive care visit? Physical exam Your health care provider will check your:  Height and weight. This may be used to calculate body mass index (BMI), which tells if you are at a healthy weight.  Heart rate and blood pressure.  Skin for abnormal spots. Counseling Your health care provider may ask you questions about your:  Alcohol, tobacco, and drug use.  Emotional well-being.  Home and relationship well-being.  Sexual activity.  Eating habits.  Work and work environment.  Method of birth control.  Menstrual cycle.  Pregnancy history. What immunizations do I need?  Influenza (flu) vaccine  This is recommended every year. Tetanus, diphtheria, and pertussis (Tdap) vaccine  You may need a Td booster every 10 years. Varicella (chickenpox) vaccine  You may need this if you have not been vaccinated. Human papillomavirus (HPV) vaccine  If recommended by your health care provider, you may need three doses over 6 months. Measles, mumps, and rubella (MMR) vaccine  You may need at least one dose of MMR. You may also need a second dose. Meningococcal conjugate (MenACWY) vaccine  One dose is recommended if you are age 19-21 years and a first-year college student living in a residence hall, or if you have one of several medical conditions. You may also need additional booster doses. Pneumococcal conjugate (PCV13) vaccine  You may need  this if you have certain conditions and were not previously vaccinated. Pneumococcal polysaccharide (PPSV23) vaccine  You may need one or two doses if you smoke cigarettes or if you have certain conditions. Hepatitis A vaccine  You may need this if you have certain conditions or if you travel or work in places where you may be exposed to hepatitis A. Hepatitis B vaccine  You may need this if you have certain conditions or if you travel or work in places where you may be exposed to hepatitis B. Haemophilus influenzae type b (Hib) vaccine  You may need this if you have certain conditions. You may receive vaccines as individual doses or as more than one vaccine together in one shot (combination vaccines). Talk with your health care provider about the risks and benefits of combination vaccines. What tests do I need?  Blood tests  Lipid and cholesterol levels. These may be checked every 5 years starting at age 20.  Hepatitis C test.  Hepatitis B test. Screening  Diabetes screening. This is done by checking your blood sugar (glucose) after you have not eaten for a while (fasting).  Sexually transmitted disease (STD) testing.  BRCA-related cancer screening. This may be done if you have a family history of breast, ovarian, tubal, or peritoneal cancers.  Pelvic exam and Pap test. This may be done every 3 years starting at age 21. Starting at age 30, this may be done every 5 years if you have a Pap test in combination with an HPV test. Talk with your health care provider about your test results, treatment options, and if necessary, the need for more tests.   Follow these instructions at home: Eating and drinking   Eat a diet that includes fresh fruits and vegetables, whole grains, lean protein, and low-fat dairy.  Take vitamin and mineral supplements as recommended by your health care provider.  Do not drink alcohol if: ? Your health care provider tells you not to drink. ? You are  pregnant, may be pregnant, or are planning to become pregnant.  If you drink alcohol: ? Limit how much you have to 0-1 drink a day. ? Be aware of how much alcohol is in your drink. In the [place] one drink equals one 12 oz bottle of beer (355 mL), one 5 oz glass of wine (148 mL), or one 1 oz glass of hard liquor (44 mL). Lifestyle  Take daily care of your teeth and gums.  Stay active. Exercise for at least 30 minutes on 5 or more days each week.  Do not use any products that contain nicotine or tobacco, such as cigarettes, e-cigarettes, and chewing tobacco. If you need help quitting, ask your health care provider.  If you are sexually active, practice safe sex. Use a condom or other form of birth control (contraception) in order to prevent pregnancy and STIs (sexually transmitted infections). If you plan to become pregnant, see your health care provider for a preconception visit. What's next?  Visit your health care provider once a year for a well check visit.  Ask your health care provider how often you should have your eyes and teeth checked.  Stay up to date on all vaccines. This information is not intended to replace advice given to you by your health care provider. Make sure you discuss any questions you have with your health care provider. Document Revised: 02/13/2018 Document Reviewed: 02/13/2018 Elsevier Patient Education  2020 [employer].

## 2019-11-24 NOTE — Progress Notes (Addendum)
Annual Exam   Chief Complaint:  Chief Complaint  Patient presents with   Annual Exam    History of Present Illness:  [name] is a 24 y.o. No obstetric history on file. who LMP was Patient's last menstrual period was 11/24/2019., presents today for her annual examination.    Low b12 wants this checked  Nutrition/Lifestyle Diet: not great since working nights - hard to figure out when to eat Exercise: not exercising due to nights shifts She does get adequate calcium and Vitamin D in her diet.  Social History   Tobacco Use  Smoking Status Never Smoker  Smokeless Tobacco Never Used   Social History   Substance and Sexual Activity  Alcohol Use Yes   Comment: social, never more than 3   Social History   Substance and Sexual Activity  Drug Use No     Safety The patient wears seatbelts: yes.     The patient feels safe at home and in their relationships: yes.  General Health Dentist in the last year: Yes Eye doctor: no but will make an appointment   Cervical Cancer Screening (Age 29-65) Last Pap:  July 2020 Results were: no abnormalities with HPV not done  Family History of Breast Cancer: MGM in 34s Family History of Ovarian Cancer: no   Sexual Health/Menses Her menses are regular every 28-30 days, She is single partner, contraception - OCP (estrogen/progesterone). Hx of STDs: none.   She does not want STI testing today. GC/C screening recommended for sexually active women 68 and younger   Weight Wt Readings from Last 3 Encounters:  11/24/19 181 lb 8 oz (82.3 kg)  08/25/19 178 lb (80.7 kg)  05/05/19 165 lb (74.8 kg)   Patient has high BMI  BMI Readings from Last 1 Encounters:  11/24/19 30.67 kg/m   Pt is switching to days on June 21 Planning to work on exercise and eating better  Chronic disease screening Blood pressure monitoring:  BP Readings from Last 3 Encounters:  11/24/19 94/70  08/25/19 120/82  05/05/19 130/85     Lipid  Monitoring: Indication for screening: age >45, obesity, diabetes, family hx, CV risk factors.  Lipid screening: Not Indicated  Lab Results  Component Value Date   CHOL 172 12/22/2018   HDL 95.80 12/22/2018   LDLCALC 58 12/22/2018   TRIG 91.0 12/22/2018   CHOLHDL 2 12/22/2018     Diabetes Screening: age >56, overweight, family hx, PCOS, hx of gestational diabetes, at risk ethnicity, elevated blood pressure >135/80.  Diabetes Screening screening: Yes  No results found for: HGBA1C    Past Medical History:  Diagnosis Date   Allergic rhinitis    has used zyrtec and nasonex in the past prn   Asthma    occ wheeze with cold and persistent coughs in remote past   Heart disease    PDA (patent ductus arteriosus)    Pulmonic stenosis, congenital    Cong Valvar PS, ballooned at 11 months    Urinary tract infection 1999    Past Surgical History:  Procedure Laterality Date   CARDIAC SURGERY  11/2015   sinus venosus ASD and PAPVR repair, PDA ligation [facility]ene [facility]den [facility] at [facility])   PULMONARY VALVULOPLASTY     [facility]   WISDOM TOOTH EXTRACTION      Prior to Admission medications   Medication Sig Start Date End Date Taking? Authorizing Provider  amitriptyline (ELAVIL) 10 MG tablet Take 1 tablet (10 mg total) by mouth at bedtime. 09/07/19  Yes Lynnda Child, MD  diphenoxylate-atropine (LOMOTIL) 2.5-0.025 MG tablet Take 1 tablet by mouth 2 (two) times daily as needed for up to 60 doses for diarrhea or loose stools. 09/07/19  Yes Lynnda Child, MD  drospirenone-ethinyl estradiol (YAZ) 3-0.02 MG tablet Take 1 tablet by mouth daily. 09/07/19  Yes Lynnda Child, MD  Multiple Vitamins-Minerals (MULTIVITAMIN PO) Take 1 tablet by mouth daily.   Yes [provider]    Allergies  Allergen Reactions   Dairy Aid [Lactase]    Oxycodone-Acetaminophen Nausea And Vomiting   Nickel Rash    Gynecologic History: Patient's last menstrual period was 11/24/2019.  Obstetric History: No  obstetric history on file.  Social History   Socioeconomic History   Marital status: Married    Spouse name: Shirley Wang   Number of children: 0   Years of education: Chief Operating Officer in Nursing   Highest education level: Not on file  Occupational History   Occupation: Charity fundraiser  Tobacco Use   Smoking status: Never Smoker   Smokeless tobacco: Never Used  Substance and Sexual Activity   Alcohol use: Yes    Comment: social, never more than 3   Drug use: No   Sexual activity: Yes    Birth control/protection: Pill  Other Topics Concern   Not on file  Social History Narrative   09/07/19   From: the area   Living: with husband Shirley Wang   Work: Engineer, 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      Family: parents nearby and good relationship      Enjoys: fishing, tennis      Exercise: working out the apartment gym and the walking trail   Diet: low sodium due to heart disease      Safety   Seat belts: Yes    Guns: Yes  and secure   Safe in relationships: yes   Social Determinants of Corporate investment banker Strain:    Difficulty of Paying Living Expenses:   Food Insecurity:    Worried About Programme researcher, broadcasting/film/video in the Last Year:    Barista in the Last Year:   Transportation Needs:    Freight forwarder (Medical):    Lack of Transportation (Non-Medical):   Physical Activity:    Days of Exercise per Week:    Minutes of Exercise per Session:   Stress:    Feeling of Stress :   Social Connections:    Frequency of Communication with Friends and Family:    Frequency of Social Gatherings with Friends and Family:    Attends Religious Services:    Active Member of Clubs or Organizations:    Attends Engineer, structural:    Marital Status:   Intimate Partner Violence:    Fear of Current or Ex-Partner:    Emotionally Abused:    Physically Abused:    Sexually Abused:     Family History  Problem Relation Age of Onset   Hypertension Father     Crohn's disease Father    Diabetes Father        pre   Asthma Brother    Breast cancer Maternal Grandmother 48    Review of Systems  Constitutional: Negative for chills and fever.  HENT: Negative for congestion and sore throat.   Eyes: Negative for blurred vision and double vision.  Respiratory: Negative for shortness of breath.   Cardiovascular: Negative for chest pain.  Gastrointestinal: Negative for heartburn, nausea and vomiting.  Genitourinary: Negative.   Musculoskeletal: Negative.  Negative for myalgias.  Skin: Negative for rash.  Neurological: Negative for dizziness and headaches.  Endo/Heme/Allergies: Does not bruise/bleed easily.  Psychiatric/Behavioral: Negative for depression. The patient is not nervous/anxious.      Physical Exam BP 94/70    Pulse 85    Temp 97.7 F (36.5 C) (Temporal)    Ht 5' 4.5" (1.638 m)    Wt 181 lb 8 oz (82.3 kg)    LMP 11/24/2019    SpO2 98%    BMI 30.67 kg/m    BP Readings from Last 3 Encounters:  11/24/19 94/70  08/25/19 120/82  05/05/19 130/85    Wt Readings from Last 3 Encounters:  11/24/19 181 lb 8 oz (82.3 kg)  08/25/19 178 lb (80.7 kg)  05/05/19 165 lb (74.8 kg)     Physical Exam Constitutional:      General: She is not in acute distress.    Appearance: She is well-developed. She is not diaphoretic.  HENT:     Head: Normocephalic and atraumatic.     Right Ear: Tympanic membrane and external ear normal.     Left Ear: Tympanic membrane and external ear normal.     Nose: Nose normal.     Mouth/Throat:     Mouth: Mucous membranes are moist.     Pharynx: No posterior oropharyngeal erythema.  Eyes:     General: No scleral icterus.    Conjunctiva/sclera: Conjunctivae normal.  Cardiovascular:     Rate and Rhythm: Normal rate and regular rhythm.     Heart sounds: No murmur.  Pulmonary:     Effort: Pulmonary effort is normal. No respiratory distress.     Breath sounds: Normal breath sounds. No wheezing.  Abdominal:      General: Bowel sounds are normal. There is no distension.     Palpations: Abdomen is soft. There is no mass.     Tenderness: There is no abdominal tenderness. There is no guarding or rebound.  Musculoskeletal:        General: Normal range of motion.     Cervical back: Neck supple.  Lymphadenopathy:     Cervical: No cervical adenopathy.  Skin:    General: Skin is warm and dry.     Capillary Refill: Capillary refill takes less than 2 seconds.  Neurological:     General: No focal deficit present.     Mental Status: She is alert and oriented to person, place, and time.     Motor: No weakness.     Deep Tendon Reflexes: Reflexes normal.  Psychiatric:        Behavior: Behavior normal.       Results: Depression screen [facility] 2/9 03/10/2019 11/04/2017  Decreased Interest 0 0  Down, Depressed, Hopeless 0 0  PHQ - 2 Score 0 0      Assessment: 24 y.o. No obstetric history on file. female here for routine annual examination.  Plan: Problem List Items Addressed This Visit      Digestive   IBS (irritable bowel syndrome)     Other   Long term (current) use of hormonal contraceptives       Screening: -- Blood pressure screen normal -- cholesterol screening: will obtain -- Weight screening: obese: discussed management options, including lifestyle, dietary, and exercise. -- Diabetes Screening: will obtain -- Nutrition: encouraged healthy diet - per patient amitriptyline helping with IBS but may be related to weight gain   Psych -- Depression screening (PHQ-9): low risk  Safety -- tobacco screening: not using -- alcohol screening:  low-risk usage. -- no evidence of domestic violence or intimate partner violence.   Cancer Screening -- pap smear not collected per ASCCP guidelines -- family history of breast cancer screening: done. not at high risk.   Immunizations -- flu vaccine up to date -- TDAP q10 years up to date  Encouraged regular exercise and healthy diet.  Encouraged regular vision and dental care  Lynnda Child

## 2019-11-25 LAB — HEPATITIS C ANTIBODY
Hepatitis C Ab: NONREACTIVE
SIGNAL TO CUT-OFF: 0 (ref <1.00)

## 2019-11-25 LAB — HIV ANTIBODY (ROUTINE TESTING W REFLEX): HIV 1&2 Ab, 4th Generation: NONREACTIVE

## 2019-11-26 ENCOUNTER — Other Ambulatory Visit: Payer: Self-pay | Admitting: *Deleted

## 2019-11-26 DIAGNOSIS — Z793 Long term (current) use of hormonal contraceptives: Secondary | ICD-10-CM

## 2019-11-26 MED ORDER — DROSPIRENONE-ETHINYL ESTRADIOL 3-0.02 MG PO TABS: 1.0000 | ORAL_TABLET | Freq: Every day | ORAL | 3 refills | Status: DC

## 2020-01-18 MED FILL — AMITRIPTYLINE HCL 10 MG TAB: 10 | 90 days supply | Qty: 90 | Fill #1

## 2020-03-10 ENCOUNTER — Encounter: Payer: Self-pay | Admitting: Family Medicine

## 2020-03-10 MED ORDER — DROSPIRENONE-ETHINYL ESTRADIOL 3-0.02 MG PO TABS: 1.0000 | ORAL_TABLET | Freq: Every day | ORAL | 11 refills | Status: DC

## 2020-03-11 MED FILL — LORYNA 3-0.02 MG TABS: 3-0.02 | 84 days supply | Qty: 84 | Fill #0

## 2020-03-31 ENCOUNTER — Other Ambulatory Visit: Payer: Self-pay

## 2020-03-31 ENCOUNTER — Telehealth: Payer: Self-pay | Admitting: Family Medicine

## 2020-03-31 DIAGNOSIS — K580 Irritable bowel syndrome with diarrhea: Secondary | ICD-10-CM

## 2020-03-31 MED ORDER — AMITRIPTYLINE HCL 10 MG PO TABS: 10.0000 mg | ORAL_TABLET | Freq: Every day | ORAL | 3 refills | Status: DC

## 2020-03-31 NOTE — Telephone Encounter (Signed)
Refill sent in as requested. 

## 2020-03-31 NOTE — Telephone Encounter (Signed)
Pt called needing to get a refill  Amitriptyline Pt has 1 week left  Pt would like to changed pharmacy to [facility]

## 2020-05-09 ENCOUNTER — Encounter: Payer: Self-pay | Admitting: Family Medicine

## 2020-05-10 ENCOUNTER — Other Ambulatory Visit: Payer: Self-pay

## 2020-05-10 MED ORDER — DROSPIRENONE-ETHINYL ESTRADIOL 3-0.02 MG PO TABS: 1.0000 | ORAL_TABLET | Freq: Every day | ORAL | 8 refills | Status: DC

## 2020-06-03 DIAGNOSIS — H5213 Myopia, bilateral: Secondary | ICD-10-CM | POA: Diagnosis not present

## 2020-06-27 ENCOUNTER — Encounter: Payer: Self-pay | Admitting: Family Medicine

## 2020-08-07 ENCOUNTER — Encounter: Payer: Self-pay | Admitting: Family Medicine

## 2020-08-18 ENCOUNTER — Telehealth: Payer: Self-pay | Admitting: Cardiology

## 2020-08-18 NOTE — Telephone Encounter (Signed)
3.3.22 LVM on mobile phone to sch 1 yr fu w/Dr. Cristal Deer lp

## 2020-10-14 ENCOUNTER — Ambulatory Visit: Payer: 59 | Admitting: Cardiology

## 2020-11-21 DIAGNOSIS — U071 COVID-19: Secondary | ICD-10-CM

## 2020-11-21 HISTORY — DX: COVID-19: U07.1

## 2020-11-22 ENCOUNTER — Other Ambulatory Visit: Payer: 59

## 2020-11-30 ENCOUNTER — Ambulatory Visit (INDEPENDENT_AMBULATORY_CARE_PROVIDER_SITE_OTHER): Payer: 59 | Admitting: Family Medicine

## 2020-11-30 ENCOUNTER — Other Ambulatory Visit: Payer: Self-pay

## 2020-11-30 ENCOUNTER — Encounter: Payer: Self-pay | Admitting: Family Medicine

## 2020-11-30 VITALS — BP 110/78 | HR 89 | Temp 97.7°F | Ht 65.0 in | Wt 186.5 lb

## 2020-11-30 DIAGNOSIS — E538 Deficiency of other specified B group vitamins: Secondary | ICD-10-CM

## 2020-11-30 DIAGNOSIS — K580 Irritable bowel syndrome with diarrhea: Secondary | ICD-10-CM

## 2020-11-30 DIAGNOSIS — Z0000 Encounter for general adult medical examination without abnormal findings: Secondary | ICD-10-CM | POA: Diagnosis not present

## 2020-11-30 MED ORDER — DIPHENOXYLATE-ATROPINE 2.5-0.025 MG PO TABS: 1.0000 | ORAL_TABLET | Freq: Two times a day (BID) | ORAL | 1 refills | Status: DC | PRN

## 2020-11-30 NOTE — Progress Notes (Signed)
Annual Exam   Chief Complaint:  Chief Complaint  Patient presents with   Annual Exam    No concerns    History of Present Illness:  [name] is a 25 y.o. No obstetric history on file. who LMP was Patient's last menstrual period was 11/22/2020., presents today for her annual examination.      Nutrition/Lifestyle Diet: healthy overall but eating late Exercise: playing with a new puppy She does get adequate calcium and Vitamin D in her diet.  Social History   Tobacco Use  Smoking Status Never  Smokeless Tobacco Never   Social History   Substance and Sexual Activity  Alcohol Use Yes   Comment: social, never more than 3   Social History   Substance and Sexual Activity  Drug Use No     Safety The patient wears seatbelts: yes.     The patient feels safe at home and in their relationships: yes.  General Health Dentist in the last year: Yes Eye doctor: yes  Menstrual Her menses are regular every 28-30 days, lasting 4 day(s).   Dysmenorrhea none. She does not have intermenstrual bleeding.  GYN She is single partner, contraception - OCP (estrogen/progesterone).     Cervical Cancer Screening (Age 57-65) Last Pap:  July 2020 Results were: no abnormalities with HPV not done  Family History of Breast Cancer: MGM Family History of Ovarian Cancer: no    Weight Wt Readings from Last 3 Encounters:  11/30/20 186 lb 8 oz (84.6 kg)  11/24/19 181 lb 8 oz (82.3 kg)  08/25/19 178 lb (80.7 kg)   Patient has high BMI  BMI Readings from Last 1 Encounters:  11/30/20 31.04 kg/m     Chronic disease screening Blood pressure monitoring:  BP Readings from Last 3 Encounters:  11/30/20 110/78  11/24/19 94/70  08/25/19 120/82     Lipid Monitoring: Indication for screening: age >24, obesity, diabetes, family hx, CV risk factors.  Lipid screening: Yes  Lab Results  Component Value Date   CHOL 175 11/24/2019   HDL 82.20 11/24/2019   LDLCALC 66 11/24/2019    TRIG 136.0 11/24/2019   CHOLHDL 2 11/24/2019     Diabetes Screening: age >1, overweight, family hx, PCOS, hx of gestational diabetes, at risk ethnicity, elevated blood pressure >135/80.  Diabetes Screening screening: Yes  Lab Results  Component Value Date   HGBA1C 5.6 11/24/2019      Past Medical History:  Diagnosis Date   Allergic rhinitis    has used zyrtec and nasonex in the past prn   Asthma    occ wheeze with cold and persistent coughs in remote past   COVID-19 11/21/2020   Heart disease    PDA (patent ductus arteriosus)    Pulmonic stenosis, congenital    Cong Valvar PS, ballooned at 11 months    Urinary tract infection 1999    Past Surgical History:  Procedure Laterality Date   CARDIAC SURGERY  11/2015   sinus venosus ASD and PAPVR repair, PDA ligation Jerelene Redden CTS at [facility])   PULMONARY VALVULOPLASTY     [facility]   WISDOM TOOTH EXTRACTION      Prior to Admission medications   Medication Sig Start Date End Date Taking? Authorizing Provider  amitriptyline (ELAVIL) 10 MG tablet Take 1 tablet (10 mg total) by mouth at bedtime. 03/31/20  Yes Lesleigh Noe, MD  diphenoxylate-atropine (LOMOTIL) 2.5-0.025 MG tablet Take 1 tablet by mouth 2 (two) times daily as needed for up to  60 doses for diarrhea or loose stools. 11/24/19  Yes Lesleigh Noe, MD  drospirenone-ethinyl estradiol (NIKKI) 3-0.02 MG tablet Take 1 tablet by mouth daily. 05/10/20  Yes Lesleigh Noe, MD  Multiple Vitamins-Minerals (MULTIVITAMIN PO) Take 1 tablet by mouth daily.   Yes [provider]  vitamin B-12 (CYANOCOBALAMIN) 1000 MCG tablet Take 1,000 mcg by mouth daily.   Yes [provider]    Allergies  Allergen Reactions   Dairy Aid [Lactase]    Oxycodone-Acetaminophen Nausea And Vomiting   Nickel Rash    Gynecologic History: Patient's last menstrual period was 11/22/2020.  Obstetric History: No obstetric history on file.  Social History   Socioeconomic History   Marital  status: Married    Spouse name: Anyi Fels   Number of children: 0   Years of education: Buyer, retail in Nursing   Highest education level: Not on file  Occupational History   Occupation: Therapist, sports  Tobacco Use   Smoking status: Never   Smokeless tobacco: Never  Substance and Sexual Activity   Alcohol use: Yes    Comment: social, never more than 3   Drug use: No   Sexual activity: Yes    Birth control/protection: Pill  Other Topics Concern   Not on file  Social History Narrative   09/07/19   From: the area   Living: with husband Einar Pheasant   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      Family: parents nearby and good relationship      Enjoys: fishing, tennis      Exercise: not currently   Diet: low sodium due to heart disease      Safety   Seat belts: Yes    Guns: Yes  and secure   Safe in relationships: yes   Social Determinants of Radio broadcast assistant Strain: Not on file  Food Insecurity: Not on file  Transportation Needs: Not on file  Physical Activity: Not on file  Stress: Not on file  Social Connections: Not on file  Intimate Partner Violence: Not on file    Family History  Problem Relation Age of Onset   Hypertension Father    Crohn's disease Father    Diabetes Father        pre   Asthma Brother    Breast cancer Maternal Grandmother 48    Review of Systems  Constitutional:  Negative for chills and fever.  HENT:  Negative for congestion and sore throat.   Eyes:  Negative for blurred vision and double vision.  Respiratory:  Negative for shortness of breath.   Cardiovascular:  Negative for chest pain.  Gastrointestinal:  Negative for heartburn, nausea and vomiting.  Genitourinary: Negative.   Musculoskeletal: Negative.  Negative for myalgias.  Skin:  Negative for rash.  Neurological:  Negative for dizziness and headaches.  Endo/Heme/Allergies:  Does not bruise/bleed easily.  Psychiatric/Behavioral:  Negative for depression. The patient is not  nervous/anxious.     Physical Exam BP 110/78   Pulse 89   Temp 97.7 F (36.5 C) (Temporal)   Ht $R'5\' 5"'tR$  (1.651 m)   Wt 186 lb 8 oz (84.6 kg)   LMP 11/22/2020   SpO2 99%   BMI 31.04 kg/m    BP Readings from Last 3 Encounters:  11/30/20 110/78  11/24/19 94/70  08/25/19 120/82    Wt Readings from Last 3 Encounters:  11/30/20 186 lb 8 oz (84.6 kg)  11/24/19 181 lb 8 oz (82.3 kg)  08/25/19  178 lb (80.7 kg)     Physical Exam Constitutional:      General: She is not in acute distress.    Appearance: She is well-developed. She is not diaphoretic.  HENT:     Head: Normocephalic and atraumatic.     Right Ear: External ear normal.     Left Ear: External ear normal.     Nose: Nose normal.  Eyes:     General: No scleral icterus.    Extraocular Movements: Extraocular movements intact.     Conjunctiva/sclera: Conjunctivae normal.  Cardiovascular:     Rate and Rhythm: Normal rate and regular rhythm.     Heart sounds: Murmur heard.  Pulmonary:     Effort: Pulmonary effort is normal. No respiratory distress.     Breath sounds: Normal breath sounds. No wheezing.  Abdominal:     General: Bowel sounds are normal. There is no distension.     Palpations: Abdomen is soft. There is no mass.     Tenderness: There is no abdominal tenderness. There is no guarding or rebound.  Musculoskeletal:        General: Normal range of motion.     Cervical back: Neck supple.  Lymphadenopathy:     Cervical: No cervical adenopathy.  Skin:    General: Skin is warm and dry.     Capillary Refill: Capillary refill takes less than 2 seconds.  Neurological:     Mental Status: She is alert and oriented to person, place, and time.     Deep Tendon Reflexes: Reflexes normal.  Psychiatric:        Mood and Affect: Mood normal.        Behavior: Behavior normal.      Results: Depression screen [facility] 2/9 11/30/2020 03/10/2019 11/04/2017  Decreased Interest 0 0 0  Down, Depressed, Hopeless 0 0 0  PHQ - 2  Score 0 0 0      Assessment: 25 y.o. No obstetric history on file. female here for routine annual examination.  Plan: Problem List Items Addressed This Visit       Digestive   IBS (irritable bowel syndrome)   Relevant Medications   diphenoxylate-atropine (LOMOTIL) 2.5-0.025 MG tablet     Other   Vitamin B12 deficiency   Other Visit Diagnoses     Annual physical exam    -  Primary        Screening: -- Blood pressure screen normal -- cholesterol screening: not due for screening -- Weight screening: overweight: continue to monitor -- Diabetes Screening: not due for screening -- Nutrition: encouraged healthy diet   Psych -- Depression screening (PHQ-9): negative   Safety -- tobacco screening: not using -- alcohol screening:  low-risk usage. -- no evidence of domestic violence or intimate partner violence. -- STD screening: gonorrhea/chlamydia NAAT (Recommended for <24 years) not collected per patient request.  Cancer Screening -- pap smear not collected per ASCCP guidelines -- family history of breast cancer screening: done. not at high risk.   Immunizations Immunization History  Administered Date(s) Administered   DTaP 04/10/1995, 07/26/1995, 09/20/1995, 05/22/1996, 08/16/2000   HPV Quadrivalent 07/09/2011, 09/20/2011, 01/02/2012   Hepatitis A 04/29/2007, 04/12/2008   Hepatitis B 1995-06-28, 04/10/1995, 01/17/1996   HiB (PRP-OMP) 04/10/1995, 07/26/1995, 09/20/1995, 05/19/1996   IPV 04/10/1995, 07/26/1995, 03/06/1996, 08/16/2000   Influenza Inj Mdck Quad Pf 04/02/2019   Influenza Nasal 03/23/2010, 05/10/2010, 03/16/2017   Influenza Split 04/15/2009, 04/30/2011, 04/22/2012   Influenza,inj,Quad PF,6+ Mos 03/16/2017   MMR 03/06/1996, 08/16/2000  Meningococcal Conjugate 04/15/2009, 12/21/2013   Moderna Sars-Covid-2 Vaccination 09/17/2019, 10/15/2019, 09/14/2020   PPD Test 10/14/2015, 10/21/2015   Tdap 01/20/2007, 10/14/2015   Varicella 05/22/1996    --  flu vaccine up to date -- TDAP q10 years up to date -- HPV vaccination series (Age <26, shared decision 27-45): received -- Covid-19 Vaccine up to date  Encouraged regular vision and dental screening. Encouraged healthy exercise and diet.   Lesleigh Noe

## 2020-11-30 NOTE — Progress Notes (Deleted)
Annual Exam   Chief Complaint:  Chief Complaint  Patient presents with  . Annual Exam    No concerns    History of Present Illness:  [name] is a 25 y.o. No obstetric history on file. who LMP was Patient's last menstrual period was 11/22/2020., presents today for her annual examination.      Nutrition/Lifestyle Diet: *** Exercise: *** She {does:18564} get adequate calcium and Vitamin D in her diet.  Social History   Tobacco Use  Smoking Status Never  Smokeless Tobacco Never   Social History   Substance and Sexual Activity  Alcohol Use Yes   Comment: social, never more than 3   Social History   Substance and Sexual Activity  Drug Use No     Safety The patient wears seatbelts: {yes/no:63}.     The patient feels safe at home and in their relationships: {yes/no:63}.  General Health Dentist in the last year: {yes/no:20286} Eye doctor: {YES/NO/NOT APPLICABLE:20182}  Menstrual Her menses are {norm/abn:715}, lasting {number: 22536} {duration:315003}.   Dysmenorrhea {dysmen:716}. She {does:18564} have intermenstrual bleeding.  GYN She is {sex active: 315163}.  Hx of STDs: {STD hx:14358} She {DOES NOT does:27190} want STI testing today.  GC/C screening recommended for sexually active women 72 and younger   Cervical Cancer Screening (Age 39-65) Last Pap:  {BJSEG:31517} *** Results were: {norm/abn:16707} with HPV {gen pos OHY:073710}  Family History of Breast Cancer: *** Family History of Ovarian Cancer: ***    Weight Wt Readings from Last 3 Encounters:  11/30/20 186 lb 8 oz (84.6 kg)  11/24/19 181 lb 8 oz (82.3 kg)  08/25/19 178 lb (80.7 kg)   Patient has {desc; low/normal/high/v high:17457} BMI  BMI Readings from Last 1 Encounters:  11/30/20 31.04 kg/m     Chronic disease screening Blood pressure monitoring:  BP Readings from Last 3 Encounters:  11/30/20 110/78  11/24/19 94/70  08/25/19 120/82     Lipid Monitoring: Indication  for screening: age >73, obesity, diabetes, family hx, CV risk factors.  Lipid screening: {Responses; yes/no/not indicated:16556}  Lab Results  Component Value Date   CHOL 175 11/24/2019   HDL 82.20 11/24/2019   LDLCALC 66 11/24/2019   TRIG 136.0 11/24/2019   CHOLHDL 2 11/24/2019     Diabetes Screening: age >57, overweight, family hx, PCOS, hx of gestational diabetes, at risk ethnicity, elevated blood pressure >135/80.  Diabetes Screening screening: {Responses; yes/no/not indicated:16556}  Lab Results  Component Value Date   HGBA1C 5.6 11/24/2019      Past Medical History:  Diagnosis Date  . Allergic rhinitis    has used zyrtec and nasonex in the past prn  . Asthma    occ wheeze with cold and persistent coughs in remote past  . COVID-19 11/21/2020  . Heart disease   . PDA (patent ductus arteriosus)   . Pulmonic stenosis, congenital    Cong Valvar PS, ballooned at 11 months   . Urinary tract infection 1999    Past Surgical History:  Procedure Laterality Date  . CARDIAC SURGERY  11/2015   sinus venosus ASD and PAPVR repair, PDA ligation [facility]relene Redden C[facility] at [facility])  . PULMONARY VALVULOPLASTY     [facility]  . WISDOM TOOTH EXTRACTION      Prior to Admission medications   Medication Sig Start Date End Date Taking? Authorizing Provider  amitriptyline (ELAVIL) 10 MG tablet Take 1 tablet (10 mg total) by mouth at bedtime. 03/31/20  Yes Lesleigh Noe, MD  diphenoxylate-atropine (LOMOTIL) 2.5-0.025  MG tablet Take 1 tablet by mouth 2 (two) times daily as needed for up to 60 doses for diarrhea or loose stools. 11/24/19  Yes Lesleigh Noe, MD  drospirenone-ethinyl estradiol (NIKKI) 3-0.02 MG tablet Take 1 tablet by mouth daily. 05/10/20  Yes Lesleigh Noe, MD  Multiple Vitamins-Minerals (MULTIVITAMIN PO) Take 1 tablet by mouth daily.   Yes [provider]  vitamin B-12 (CYANOCOBALAMIN) 1000 MCG tablet Take 1,000 mcg by mouth daily.   Yes [provider]    Allergies   Allergen Reactions  . Dairy Aid [Lactase]   . Oxycodone-Acetaminophen Nausea And Vomiting  . Nickel Rash    Gynecologic History: Patient's last menstrual period was 11/22/2020.  Obstetric History: No obstetric history on file.  Social History   Socioeconomic History  . Marital status: Married    Spouse name: Shareka Casale  . Number of children: 0  . Years of education: Bachelors in Nursing  . Highest education level: Not on file  Occupational History  . Occupation: Therapist, sports  Tobacco Use  . Smoking status: Never  . Smokeless tobacco: Never  Substance and Sexual Activity  . Alcohol use: Yes    Comment: social, never more than 3  . Drug use: No  . Sexual activity: Yes    Birth control/protection: Pill  Other Topics Concern  . 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 Hypertension Father   . Crohn's disease Father   . Diabetes Father        pre  . Asthma Brother   . Breast cancer Maternal Grandmother 48    ROS   Physical Exam BP 110/78   Pulse 89   Temp 97.7 F (36.5 C) (Temporal)   Ht _0  (1.651 m)   Wt 186 lb 8 oz (84.6 kg)   LMP 11/22/2020   SpO2 99%   BMI 31.04 kg/m    BP Readings from Last 3 Encounters:  11/30/20 110/78  11/24/19 94/70  08/25/19 120/82    Wt Readings from Last 3 Encounters:   11/30/20 186 lb 8 oz (84.6 kg)  11/24/19 181 lb 8 oz (82.3 kg)  08/25/19 178 lb (80.7 kg)     Physical Exam    Results: Depression screen [facility] 2/9 03/10/2019 11/04/2017  Decreased Interest 0 0  Down, Depressed, Hopeless 0 0  PHQ - 2 Score 0 0      Assessment: 25 y.o. No obstetric history on file. female here for routine annual examination.  Plan: Problem List Items Addressed This Visit       Digestive   IBS (irritable bowel syndrome)     Screening: --  Blood pressure screen {Blank single:19197::"normal","elevated: continued to monitor.","managed by PCP"} -- cholesterol screening: {Blank single:19197::"will obtain","per PCP","not due for screening"} -- Weight screening: {Blank single:19197::"obese: discussed management options, including lifestyle, dietary, and exercise.","overweight: continue to monitor","normal"} -- Diabetes Screening: {Blank single:19197::"will obtain","per PCP","not due for screening"} -- Nutrition: encouraged healthy diet   Psych -- Depression screening (PHQ-9):    Safety -- tobacco screening: {Blank single:19197::"using: discussed quitting using the 5 A's","not using"} -- alcohol screening: *** low-risk usage. -- no*** evidence of domestic violence or intimate partner violence. -- STD screening: gonorrhea/chlamydia NAAT (Recommended for <24 years) {Blank single:19197::"not collected per patient request.","collected"}  Cancer Screening -- pap smear {Blank single:19197::"collected","not collected"} per ASCCP guidelines -- family history of breast cancer screening: done. not at high risk.   Immunizations Immunization History  Administered Date(s) Administered  . DTaP 04/10/1995, 07/26/1995, 09/20/1995, 05/22/1996, 08/16/2000  . HPV Quadrivalent 07/09/2011, 09/20/2011, 01/02/2012  . Hepatitis A 04/29/2007, 04/12/2008  . Hepatitis B 03/05/95, 04/10/1995, 01/17/1996  . HiB (PRP-OMP) 04/10/1995, 07/26/1995, 09/20/1995, 05/19/1996  . IPV  04/10/1995, 07/26/1995, 03/06/1996, 08/16/2000  . Influenza Inj Mdck Quad Pf 04/02/2019  . Influenza Nasal 03/23/2010, 05/10/2010, 03/16/2017  . Influenza Split 04/15/2009, 04/30/2011, 04/22/2012  . Influenza,inj,Quad PF,6+ Mos 03/16/2017  . MMR 03/06/1996, 08/16/2000  . Meningococcal Conjugate 04/15/2009, 12/21/2013  . Moderna Sars-Covid-2 Vaccination 09/17/2019, 10/15/2019  . PPD Test 10/14/2015, 10/21/2015  . Tdap 01/20/2007, 10/14/2015  . Varicella 05/22/1996    -- flu vaccine {Immunization status:11719} -- TDAP q10 years {Immunization status:11719} -- PPSV-23 (19-64 with chronic disease or smoking) {Immunization status:11719} -- HPV vaccination series (Age <26, shared decision 27-45): {Blank single:19197::"received","has not received - will orderd","has not received - pt refuses","not eligilbe"} -- Covid-19 Vaccine {Immunization status:11719}  Encouraged regular vision and dental screening. Encouraged healthy exercise and diet.   Lesleigh Noe

## 2021-02-08 ENCOUNTER — Other Ambulatory Visit: Payer: Self-pay | Admitting: Family Medicine

## 2021-02-08 ENCOUNTER — Encounter: Payer: Self-pay | Admitting: Family Medicine

## 2021-03-30 ENCOUNTER — Other Ambulatory Visit: Payer: Self-pay | Admitting: Family Medicine

## 2021-03-30 DIAGNOSIS — K580 Irritable bowel syndrome with diarrhea: Secondary | ICD-10-CM

## 2021-04-19 DIAGNOSIS — J0100 Acute maxillary sinusitis, unspecified: Secondary | ICD-10-CM | POA: Diagnosis not present

## 2021-06-28 ENCOUNTER — Encounter (HOSPITAL_BASED_OUTPATIENT_CLINIC_OR_DEPARTMENT_OTHER): Payer: Self-pay

## 2021-06-28 ENCOUNTER — Telehealth (HOSPITAL_BASED_OUTPATIENT_CLINIC_OR_DEPARTMENT_OTHER): Payer: Self-pay

## 2021-06-28 DIAGNOSIS — Q249 Congenital malformation of heart, unspecified: Secondary | ICD-10-CM

## 2021-06-28 NOTE — Telephone Encounter (Signed)
Hi there can you please assist with getting this patient an appointment and Echo scheduled please?

## 2021-06-28 NOTE — Telephone Encounter (Signed)
Patient sent mychart message wanting to schedule ECHO and yearly appointment. Per OV note with Dr. Cristal Deer patient is to have ECHO before next office visit. Order placed and sent to scheduling team.

## 2021-08-23 ENCOUNTER — Other Ambulatory Visit: Payer: Self-pay

## 2021-08-23 ENCOUNTER — Ambulatory Visit (INDEPENDENT_AMBULATORY_CARE_PROVIDER_SITE_OTHER): Payer: 59

## 2021-08-23 DIAGNOSIS — Q249 Congenital malformation of heart, unspecified: Secondary | ICD-10-CM | POA: Diagnosis not present

## 2021-08-23 LAB — ECHOCARDIOGRAM COMPLETE
AR max vel: 1.46 cm2
AV Area VTI: 1.48 cm2
AV Area mean vel: 1.48 cm2
AV Mean grad: 7 mmHg
AV Peak grad: 13.2 mmHg
AV Vena cont: 0.28 cm
Ao pk vel: 1.82 m/s
Area-P 1/2: 4.06 cm2
Calc EF: 65.1 %
S' Lateral: 3.3 cm
Single Plane A2C EF: 65.7 %
Single Plane A4C EF: 64.7 %

## 2021-08-24 ENCOUNTER — Encounter (HOSPITAL_BASED_OUTPATIENT_CLINIC_OR_DEPARTMENT_OTHER): Payer: Self-pay

## 2021-09-06 ENCOUNTER — Encounter (HOSPITAL_BASED_OUTPATIENT_CLINIC_OR_DEPARTMENT_OTHER): Payer: Self-pay

## 2021-10-09 ENCOUNTER — Other Ambulatory Visit (HOSPITAL_BASED_OUTPATIENT_CLINIC_OR_DEPARTMENT_OTHER): Payer: Self-pay

## 2021-10-09 DIAGNOSIS — Q249 Congenital malformation of heart, unspecified: Secondary | ICD-10-CM

## 2021-10-13 ENCOUNTER — Encounter: Payer: Self-pay | Admitting: Family Medicine

## 2021-11-09 ENCOUNTER — Telehealth: Payer: 59 | Admitting: Physician Assistant

## 2021-11-09 DIAGNOSIS — L237 Allergic contact dermatitis due to plants, except food: Secondary | ICD-10-CM | POA: Diagnosis not present

## 2021-11-09 MED ORDER — PREDNISONE 10 MG PO TABS: ORAL_TABLET | ORAL | 0 refills | Status: AC

## 2021-11-09 NOTE — Progress Notes (Signed)
I have spent 5 minutes in review of e-visit questionnaire, review and updating patient chart, medical decision making and response to patient.   Ravon Mortellaro Cody Alexsis Kathman, PA-C    

## 2021-11-09 NOTE — Progress Notes (Signed)
E-Visit for [employer]  We are sorry that you are not feeing well.  Here is how we plan to help!  Based on what you have shared with me it looks like you have had an allergic reaction to the oily resin from a group of plants.  This resin is very sticky, so it easily attaches to your skin, clothing, tools equipment, and pet's fur.    This blistering rash is often called poison ivy rash although it can come from contact with the leaves, stems and roots of poison ivy, poison oak and poison sumac.  The oily resin contains urushiol (u-ROO-she-ol) that produces a skin rash on exposed skin.  The severity of the rash depends on the amount of urushiol that gets on your skin.  A section of skin with more urushiol on it may develop a rash sooner.  The rash usually develops 12-48 hours after exposure and can last two to three weeks.  Your skin must come in direct contact with the plant's oil to be affected.  Blister fluid doesn't spread the rash.  However, if you come into contact with a piece of clothing or pet fur that has urushiol on it, the rash may spread out.  You can also transfer the oil to other parts of your body with your fingers.  Often the rash looks like a straight line because of the way the plant brushes against your skin.  Since your rash is widespread or has resulted in a large number of blisters, I have prescribed an oral corticosteroid.  Please follow these recommendations:  I have sent a prednisone dose pack to your chosen pharmacy. Be sure to follow the instructions carefully (found below as well as on the Rx)  and complete the entire prescription. You may use Benadryl or Caladryl topical lotions to sooth the itch and remember cool, not hot, showers and baths can help relieve the itching!  Place cool, wet compresses on the affected area for 15-30 minutes several times a day.  You may also take oral antihistamines, such as diphenhydramine (Benadryl, others), which may also help you sleep better.   Watch your skin for any purulent (pus) drainage or red streaking from the site.  If this occurs, contact your provider.  You may require an antibiotic for a skin infection.  Make sure that the clothes you were wearing as well as any towels or sheets that may have come in contact with the oil (urushiol) are washed in detergent and hot water.       I have developed the following plan to treat your condition I am prescribing a two week course of steroids (37 tablets of 10 mg prednisone).  Days 1-4 take 4 tablets (40 mg) daily  Days 5-8 take 3 tablets (30 mg) daily, Days 9-11 take 2 tablets (20 mg) daily, Days 12-14 take 1 tablet (10 mg) daily.    What can you do to prevent this rash?  Avoid the plants.  Learn how to identify poison ivy, poison oak and poison sumac in all seasons.  When hiking or engaging in other activities that might expose you to these plants, try to stay on cleared pathways.  If camping, make sure you pitch your tent in an area free of these plants.  Keep pets from running through wooded areas so that urushiol doesn't accidentally stick to their fur, which you may touch.  Remove or kill the plants.  In your yard, you can get rid of poison ivy  by applying an herbicide or pulling it out of the ground, including the roots, while wearing heavy gloves.  Afterward remove the gloves and thoroughly wash them and your hands.  Don't burn poison ivy or related plants because the urushiol can be carried by smoke.  Wear protective clothing.  If needed, protect your skin by wearing socks, boots, pants, long sleeves and vinyl gloves.  Wash your skin right away.  Washing off the oil with soap and water within 30 minutes of exposure may reduce your chances of getting a poison ivy rash.  Even washing after an hour or so can help reduce the severity of the rash.  If you walk through some poison ivy and then later touch your shoes, you may get some urushiol on your hands, which may then transfer to your  face or body by touching or rubbing.  If the contaminated object isn't cleaned, the urushiol on it can still cause a skin reaction years later.    Be careful not to reuse towels after you have washed your skin.  Also carefully wash clothing in detergent and hot water to remove all traces of the oil.  Handle contaminated clothing carefully so you don't transfer the urushiol to yourself, furniture, rugs or appliances.  Remember that pets can carry the oil on their fur and paws.  If you think your pet may be contaminated with urushiol, put on some long rubber gloves and give your pet a bath.  Finally, be careful not to burn these plants as the smoke can contain traces of the oil.  Inhaling the smoke may result in difficulty breathing. If that occurred you should see a physician as soon as possible.  See your doctor right away if:  The reaction is severe or widespread You inhaled the smoke from burning poison ivy and are having difficulty breathing Your skin continues to swell The rash affects your eyes, mouth or genitals Blisters are oozing pus You develop a fever greater than 100 F (37.8 C) The rash doesn't get better within a few weeks.  If you scratch the poison ivy rash, bacteria under your fingernails may cause the skin to become infected.  See your doctor if pus starts oozing from the blisters.  Treatment generally includes antibiotics.  Poison ivy treatments are usually limited to self-care methods.  And the rash typically goes away on its own in two to three weeks.     If the rash is widespread or results in a large number of blisters, your doctor may prescribe an oral corticosteroid, such as prednisone.  If a bacterial infection has developed at the rash site, your doctor may give you a prescription for an oral antibiotic.  MAKE SURE YOU  Understand these instructions. Will watch your condition. Will get help right away if you are not doing well or get worse.   Thank you for  choosing an e-visit.  Your e-visit answers were reviewed by a board certified advanced clinical practitioner to complete your personal care plan. Depending upon the condition, your plan could have included both over the counter or prescription medications.  Please review your pharmacy choice. Make sure the pharmacy is open so you can pick up prescription now. If there is a problem, you may contact your provider through [employer] and have the prescription routed to another pharmacy.  Your safety is important to [place]. If you have drug allergies check your prescription carefully.   For the next 24 hours you can use MyChart  to ask questions about today's visit, request a non-urgent call back, or ask for a work or school excuse. You will get an email in the next two days asking about your experience. I hope that your e-visit has been valuable and will speed your recovery.

## 2021-12-04 ENCOUNTER — Ambulatory Visit (INDEPENDENT_AMBULATORY_CARE_PROVIDER_SITE_OTHER): Payer: 59 | Admitting: Family Medicine

## 2021-12-04 VITALS — BP 122/80 | HR 108 | Temp 97.7°F | Ht 64.9 in | Wt 191.0 lb

## 2021-12-04 DIAGNOSIS — E538 Deficiency of other specified B group vitamins: Secondary | ICD-10-CM | POA: Diagnosis not present

## 2021-12-04 DIAGNOSIS — Q249 Congenital malformation of heart, unspecified: Secondary | ICD-10-CM

## 2021-12-04 DIAGNOSIS — R6889 Other general symptoms and signs: Secondary | ICD-10-CM

## 2021-12-04 DIAGNOSIS — K580 Irritable bowel syndrome with diarrhea: Secondary | ICD-10-CM | POA: Diagnosis not present

## 2021-12-04 DIAGNOSIS — Z0000 Encounter for general adult medical examination without abnormal findings: Secondary | ICD-10-CM

## 2021-12-04 DIAGNOSIS — E669 Obesity, unspecified: Secondary | ICD-10-CM

## 2021-12-04 LAB — LIPID PANEL
Cholesterol: 172 mg/dL (ref 0–200)
HDL: 88.1 mg/dL (ref >39.00)
LDL Cholesterol: 53 mg/dL (ref 0–99)
NonHDL: 83.98
Total CHOL/HDL Ratio: 2
Triglycerides: 154 mg/dL — ABNORMAL HIGH (ref 0.0–149.0)
VLDL: 30.8 mg/dL (ref 0.0–40.0)

## 2021-12-04 LAB — FERRITIN: Ferritin: 160.1 ng/mL (ref 10.0–291.0)

## 2021-12-04 LAB — COMPREHENSIVE METABOLIC PANEL
ALT: 12 U/L (ref 0–35)
AST: 13 U/L (ref 0–37)
Albumin: 4.1 g/dL (ref 3.5–5.2)
Alkaline Phosphatase: 43 U/L (ref 39–117)
BUN: 10 mg/dL (ref 6–23)
CO2: 28 mEq/L (ref 19–32)
Calcium: 9.7 mg/dL (ref 8.4–10.5)
Chloride: 105 mEq/L (ref 96–112)
Creatinine, Ser: 0.79 mg/dL (ref 0.40–1.20)
GFR: 102.94 mL/min (ref >60.00)
Glucose, Bld: 99 mg/dL (ref 70–99)
Potassium: 4.2 mEq/L (ref 3.5–5.1)
Sodium: 140 mEq/L (ref 135–145)
Total Bilirubin: 0.3 mg/dL (ref 0.2–1.2)
Total Protein: 6.7 g/dL (ref 6.0–8.3)

## 2021-12-04 LAB — HEMOGLOBIN A1C: Hgb A1c MFr Bld: 5.6 % (ref 4.6–6.5)

## 2021-12-04 LAB — CBC
HCT: 41.2 % (ref 36.0–46.0)
Hemoglobin: 13.6 g/dL (ref 12.0–15.0)
MCHC: 33 g/dL (ref 30.0–36.0)
MCV: 87.3 fl (ref 78.0–100.0)
Platelets: 282 10*3/uL (ref 150.0–400.0)
RBC: 4.72 Mil/uL (ref 3.87–5.11)
RDW: 12.8 % (ref 11.5–15.5)
WBC: 7.4 10*3/uL (ref 4.0–10.5)

## 2021-12-04 LAB — VITAMIN B12: Vitamin B-12: 1146 pg/mL — ABNORMAL HIGH (ref 211–911)

## 2021-12-04 LAB — TSH: TSH: 1.33 u[IU]/mL (ref 0.35–5.50)

## 2021-12-04 MED ORDER — DIPHENOXYLATE-ATROPINE 2.5-0.025 MG PO TABS: 1.0000 | ORAL_TABLET | Freq: Two times a day (BID) | ORAL | 1 refills | Status: DC | PRN

## 2021-12-04 NOTE — Progress Notes (Signed)
Annual Exam   Chief Complaint:  Chief Complaint  Patient presents with   Annual Exam    No concerns   Referral    OBGYN in [place]   Gynecologic Exam    History of Present Illness:  [name] is a 26 y.o. No obstetric history on file. who LMP was Patient's last menstrual period was 11/14/2021 (exact date)., presents today for her annual examination.      Nutrition/Lifestyle Diet: healthy diet, smoothies for lunch, dinner late Exercise: walking 1/2 mile per day, walking her dog She does get adequate calcium and Vitamin D in her diet.  Social History   Tobacco Use  Smoking Status Never  Smokeless Tobacco Never   Social History   Substance and Sexual Activity  Alcohol Use Yes   Comment: social, never more than 3   Social History   Substance and Sexual Activity  Drug Use No     Safety The patient wears seatbelts: yes.     The patient feels safe at home and in their relationships: yes.  General Health Dentist in the last year: Yes Eye doctor: yes  Menstrual Her menses are on birth control. No cycle since starting ocp - no bleeding even with the placebo  GYN She is single partner, contraception - OCP (estrogen/progesterone).    Cervical Cancer Screening (Age 86-65) Last Pap:  July 2020 Results were: no abnormalities with HPV not done    Weight Wt Readings from Last 3 Encounters:  12/04/21 191 lb (86.6 kg)  11/30/20 186 lb 8 oz (84.6 kg)  11/24/19 181 lb 8 oz (82.3 kg)   Patient has high BMI  BMI Readings from Last 1 Encounters:  12/04/21 31.88 kg/m     Chronic disease screening Blood pressure monitoring:  BP Readings from Last 3 Encounters:  12/04/21 122/80  11/30/20 110/78  11/24/19 94/70     Lipid Monitoring: Indication for screening: age >1, obesity, diabetes, family hx, CV risk factors.  Lipid screening: Yes  Lab Results  Component Value Date   CHOL 175 11/24/2019   HDL 82.20 11/24/2019   LDLCALC 66 11/24/2019   TRIG  136.0 11/24/2019   CHOLHDL 2 11/24/2019     Diabetes Screening: age >51, overweight, family hx, PCOS, hx of gestational diabetes, at risk ethnicity, elevated blood pressure >135/80.  Diabetes Screening screening: Yes  Lab Results  Component Value Date   HGBA1C 5.6 11/24/2019      Past Medical History:  Diagnosis Date   Allergic rhinitis    has used zyrtec and nasonex in the past prn   Asthma    occ wheeze with cold and persistent coughs in remote past   COVID-19 11/21/2020   Heart disease    PDA (patent ductus arteriosus)    Pulmonic stenosis, congenital    Cong Valvar PS, ballooned at 11 months    Urinary tract infection 1999    Past Surgical History:  Procedure Laterality Date   CARDIAC SURGERY  11/2015   sinus venosus ASD and PAPVR repair, PDA ligation Shirley Wang CTS at [facility])   PULMONARY VALVULOPLASTY     [facility]   WISDOM TOOTH EXTRACTION      Prior to Admission medications   Medication Sig Start Date End Date Taking? Authorizing Provider  amitriptyline (ELAVIL) 10 MG tablet TAKE ONE TABLET BY MOUTH EVERY NIGHT AT BEDTIME 03/31/21  Yes Lesleigh Noe, MD  diphenoxylate-atropine (LOMOTIL) 2.5-0.025 MG tablet Take 1 tablet by mouth 2 (two) times daily as needed  for up to 60 doses for diarrhea or loose stools. 11/30/20  Yes Lesleigh Noe, MD  Multiple Vitamins-Minerals (MULTIVITAMIN PO) Take 1 tablet by mouth daily.   Yes [provider]  NIKKI 3-0.02 MG tablet TAKE ONE TABLET BY MOUTH DAILY 02/09/21  Yes Lesleigh Noe, MD  vitamin B-12 (CYANOCOBALAMIN) 1000 MCG tablet Take 1,000 mcg by mouth daily.   Yes [provider]    Allergies  Allergen Reactions   Dairy Aid [Tilactase]    Oxycodone-Acetaminophen Nausea And Vomiting   Nickel Rash    Gynecologic History: Patient's last menstrual period was 11/14/2021 (exact date).  Obstetric History: No obstetric history on file.  Social History   Socioeconomic History   Marital status: Married    Spouse  name: Carina Chaplin   Number of children: 0   Years of education: Buyer, retail in Nursing   Highest education level: Not on file  Occupational History   Occupation: Therapist, sports  Tobacco Use   Smoking status: Never   Smokeless tobacco: Never  Substance and Sexual Activity   Alcohol use: Yes    Comment: social, never more than 3   Drug use: No   Sexual activity: Yes    Birth control/protection: Pill  Other Topics Concern   Not on file  Social History Narrative   09/07/19   From: the area   Living: with husband Einar Pheasant   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      Family: parents nearby and good relationship      Enjoys: fishing, tennis      Exercise: not currently   Diet: low sodium due to heart disease      Safety   Seat belts: Yes    Guns: Yes  and secure   Safe in relationships: yes   Social Determinants of Radio broadcast assistant Strain: Not on file  Food Insecurity: Not on file  Transportation Needs: Not on file  Physical Activity: Not on file  Stress: Not on file  Social Connections: Not on file  Intimate Partner Violence: Not on file    Family History  Problem Relation Age of Onset   Hypertension Father    Crohn's disease Father    Diabetes Father        pre   Asthma Brother    Breast cancer Maternal Grandmother 48    Review of Systems  Constitutional:  Negative for chills and fever.  HENT:  Negative for congestion and sore throat.   Eyes:  Negative for blurred vision and double vision.  Respiratory:  Negative for shortness of breath.   Cardiovascular:  Negative for chest pain.  Gastrointestinal:  Negative for heartburn, nausea and vomiting.  Genitourinary: Negative.   Musculoskeletal: Negative.  Negative for myalgias.  Skin:  Negative for rash.  Neurological:  Negative for dizziness and headaches.  Endo/Heme/Allergies:  Does not bruise/bleed easily.  Psychiatric/Behavioral:  Negative for depression. The patient is not nervous/anxious.       Physical Exam BP 122/80   Pulse (!) 108   Temp 97.7 F (36.5 C) (Temporal)   Ht 5' 4.9" (1.648 m)   Wt 191 lb (86.6 kg)   LMP 11/14/2021 (Exact Date)   SpO2 100%   BMI 31.88 kg/m    BP Readings from Last 3 Encounters:  12/04/21 122/80  11/30/20 110/78  11/24/19 94/70    Wt Readings from Last 3 Encounters:  12/04/21 191 lb (86.6 kg)  11/30/20 186 lb 8 oz (84.6  kg)  11/24/19 181 lb 8 oz (82.3 kg)     Physical Exam Constitutional:      General: She is not in acute distress.    Appearance: She is well-developed. She is not diaphoretic.  HENT:     Head: Normocephalic and atraumatic.     Right Ear: External ear normal.     Left Ear: External ear normal.     Nose: Nose normal.  Eyes:     General: No scleral icterus.    Extraocular Movements: Extraocular movements intact.     Conjunctiva/sclera: Conjunctivae normal.  Cardiovascular:     Rate and Rhythm: Normal rate and regular rhythm.  Pulmonary:     Effort: Pulmonary effort is normal. No respiratory distress.     Breath sounds: Normal breath sounds. No wheezing.  Abdominal:     General: Bowel sounds are normal. There is no distension.     Palpations: Abdomen is soft. There is no mass.     Tenderness: There is no abdominal tenderness. There is no guarding or rebound.  Musculoskeletal:        General: Normal range of motion.     Cervical back: Neck supple.  Lymphadenopathy:     Cervical: No cervical adenopathy.  Skin:    General: Skin is warm and dry.     Capillary Refill: Capillary refill takes less than 2 seconds.  Neurological:     Mental Status: She is alert and oriented to person, place, and time.     Deep Tendon Reflexes: Reflexes normal.  Psychiatric:        Mood and Affect: Mood normal.        Behavior: Behavior normal.       Results:    12/04/2021   10:29 AM 11/30/2020   12:28 PM 03/10/2019    7:59 AM  Depression screen PHQ 2/9  Decreased Interest 0 0 0  Down, Depressed, Hopeless 0 0 0  PHQ  - 2 Score 0 0 0      Assessment: 26 y.o. No obstetric history on file. female here for routine annual examination.  Plan: Problem List Items Addressed This Visit       Cardiovascular and Mediastinum   Congenital heart disease   Relevant Orders   Comprehensive metabolic panel     Digestive   IBS (irritable bowel syndrome)   Relevant Medications   diphenoxylate-atropine (LOMOTIL) 2.5-0.025 MG tablet     Other   Vitamin B12 deficiency   Relevant Orders   Vitamin B12   Other Visit Diagnoses     Annual physical exam    -  Primary   Cold intolerance       Relevant Orders   TSH   CBC   Ferritin   Obesity (BMI 30.0-34.9)       Relevant Orders   Lipid panel   Hemoglobin A1c        Screening: -- Blood pressure screen normal -- cholesterol screening: will obtain -- Weight screening: overweight: continue to monitor -- Diabetes Screening: will obtain -- Nutrition: encouraged healthy diet   Psych -- Depression screening (PHQ-9):     12/04/2021   10:29 AM 11/30/2020   12:28 PM 03/10/2019    7:59 AM  Depression screen PHQ 2/9  Decreased Interest 0 0 0  Down, Depressed, Hopeless 0 0 0  PHQ - 2 Score 0 0 0       Safety -- tobacco screening: not using -- alcohol screening:  low-risk usage. -- no evidence  of domestic violence or intimate partner violence.   Cancer Screening -- pap smear not collected patient planning to establish with new ob in the next 6 months -- family history of breast cancer screening: done. not at high risk.   Immunizations Immunization History  Administered Date(s) Administered   DTaP 04/10/1995, 07/26/1995, 09/20/1995, 05/22/1996, 08/16/2000   HPV Quadrivalent 07/09/2011, 09/20/2011, 01/02/2012   Hepatitis A 04/29/2007, 04/12/2008   Hepatitis B 1994[birth year], 04/10/1995, 01/17/1996   HiB (PRP-OMP) 04/10/1995, 07/26/1995, 09/20/1995, 05/19/1996   IPV 04/10/1995, 07/26/1995, 03/06/1996, 08/16/2000   Influenza Inj Mdck Quad Pf  04/02/2019   Influenza Nasal 03/23/2010, 05/10/2010, 03/16/2017   Influenza Split 04/15/2009, 04/30/2011, 04/22/2012   Influenza,inj,Quad PF,6+ Mos 03/16/2017   MMR 03/06/1996, 08/16/2000   Meningococcal Conjugate 04/15/2009, 12/21/2013   Moderna Sars-Covid-2 Vaccination 09/17/2019, 10/15/2019, 09/14/2020   PPD Test 10/14/2015, 10/21/2015   Tdap 01/20/2007, 10/14/2015   Varicella 05/22/1996    -- flu vaccine up to date -- TDAP q10 years up to date -- -- HPV vaccination series (Age <26, shared decision 27-45): received -- Covid-19 Vaccine up to date  Encouraged regular vision and dental screening. Encouraged healthy exercise and diet.   Lesleigh Noe

## 2021-12-04 NOTE — Patient Instructions (Signed)
Call and update with where to send the referral.

## 2021-12-11 ENCOUNTER — Ambulatory Visit: Payer: Self-pay

## 2021-12-11 ENCOUNTER — Other Ambulatory Visit: Payer: Self-pay | Admitting: Nurse Practitioner

## 2021-12-11 DIAGNOSIS — M79645 Pain in left finger(s): Secondary | ICD-10-CM

## 2022-01-07 ENCOUNTER — Other Ambulatory Visit: Payer: Self-pay | Admitting: Family Medicine

## 2022-03-23 ENCOUNTER — Other Ambulatory Visit: Payer: Self-pay | Admitting: Family Medicine

## 2022-03-23 DIAGNOSIS — K580 Irritable bowel syndrome with diarrhea: Secondary | ICD-10-CM

## 2022-06-07 DIAGNOSIS — Z1151 Encounter for screening for human papillomavirus (HPV): Secondary | ICD-10-CM | POA: Diagnosis not present

## 2022-06-07 DIAGNOSIS — Z124 Encounter for screening for malignant neoplasm of cervix: Secondary | ICD-10-CM | POA: Diagnosis not present

## 2022-06-07 DIAGNOSIS — Z01419 Encounter for gynecological examination (general) (routine) without abnormal findings: Secondary | ICD-10-CM | POA: Diagnosis not present

## 2022-06-07 DIAGNOSIS — Q249 Congenital malformation of heart, unspecified: Secondary | ICD-10-CM | POA: Diagnosis not present

## 2022-06-07 DIAGNOSIS — Z1389 Encounter for screening for other disorder: Secondary | ICD-10-CM | POA: Diagnosis not present

## 2022-06-07 DIAGNOSIS — Z319 Encounter for procreative management, unspecified: Secondary | ICD-10-CM | POA: Diagnosis not present

## 2022-06-07 LAB — HM PAP SMEAR

## 2022-07-04 ENCOUNTER — Ambulatory Visit: Payer: Commercial Managed Care - PPO

## 2022-07-10 ENCOUNTER — Ambulatory Visit: Payer: Commercial Managed Care - PPO

## 2022-07-27 ENCOUNTER — Ambulatory Visit: Payer: Commercial Managed Care - PPO | Attending: Obstetrics and Gynecology | Admitting: Obstetrics

## 2022-07-27 ENCOUNTER — Ambulatory Visit: Payer: Commercial Managed Care - PPO

## 2022-07-27 ENCOUNTER — Telehealth: Payer: Self-pay | Admitting: *Deleted

## 2022-07-27 DIAGNOSIS — Z8774 Personal history of (corrected) congenital malformations of heart and circulatory system: Secondary | ICD-10-CM | POA: Diagnosis not present

## 2022-07-27 DIAGNOSIS — Z3169 Encounter for other general counseling and advice on procreation: Secondary | ICD-10-CM | POA: Diagnosis not present

## 2022-07-27 DIAGNOSIS — I519 Heart disease, unspecified: Secondary | ICD-10-CM

## 2022-07-27 DIAGNOSIS — K589 Irritable bowel syndrome without diarrhea: Secondary | ICD-10-CM | POA: Diagnosis not present

## 2022-07-27 DIAGNOSIS — Q249 Congenital malformation of heart, unspecified: Secondary | ICD-10-CM | POA: Diagnosis not present

## 2022-07-27 NOTE — Telephone Encounter (Signed)
-----   Message from Wayne, MD sent at 07/27/2022 11:37 AM EST ----- Regarding: RE: Patient for preconception consult for repaired congenital heart disease I just saw this patient.    She looks like she is doing well and is asymptomatic. She is able to exercise and perform daily functions without any difficulty.  I think she should do fine during her future pregnancy.  She already has an appointment to see you on March 8.    Please let me know what you think after you see her.    Thank you  Christy Sartorius  ----- Message ----- From: Freada Bergeron, MD Sent: 07/27/2022   9:13 AM EST To: Nuala Alpha, LPN; Berniece Salines, DO; # Subject: RE: Patient for preconception consult for re#  I think that is a great idea. Looks like she had an echo done in 2019 which showed a complete repair with a patent  right upper pulmonary vein baffle to left atrium. It is probably not a bad idea to have her seen and repeat an echo before conception just to make sure RV/LV look okay with no residual shunting.   We are happy to get her in! I will add Yonah Tangeman and Jasmine to get her scheduled!  Sincerely, Heather ----- Message ----- From: Ulice Dash, MD Sent: 07/26/2022   8:19 PM EST To: Berniece Salines, DO; Freada Bergeron, MD Subject: Patient for preconception consult for repair#  Hi   I am seeing this patient tomorrow for a preconception consult.    It appears that she was born with pulmonary stenosis and underwent a balloon pulmonary valvuloplasty as an infant.    In 2017,she had open heart surgery and had a PDA repair, partial anomalous pulmonary venous return repair, and sinus venosus ASD repair at [facility].    The patient sees Dr. Buford Dresser in cardiology.    I think the patient has a lot of questions regarding the management of her cardiac condition during pregnancy.    I believe that since she has undergone a complete repair, her pregnancy should be relatively  uneventful.    Do think it would be beneficial for her to see either of the two of you for consultation before she conceives?    Thank you  Christy Sartorius

## 2022-07-27 NOTE — Progress Notes (Signed)
Pt here for preconception consult BP: 158/92 HR: 89

## 2022-07-27 NOTE — Progress Notes (Signed)
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  She underwent a balloon pulmonary valvuloplasty as an infant due to pulmonary stenosis.  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  She had an echocardiogram in 2019 that showed a complete repair with a patent right upper pulmonary vein baffle to the left atrium.  Her left ventricular ejection fraction was 67%.  She reports that she is doing well following her surgery and has no issues or symptoms performing daily activity or exercising on the treadmill or elliptical.  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  She reports a history of irritable bowel syndrome for which she is treated with amitriptyline and Lomotil as needed.  Her past surgical history includes cardiac surgery for repair of the congenital heart defect and wisdom teeth removal.  Her current medications include amitriptyline, prenatal vitamin, and Lomotil as needed. The patient is planning on conceiving a pregnancy within the next year.    She was seen today to discuss the management and issues associated with her congenital heart defect and her future pregnancy.  The patient was advised that as she has had a complete repair of her congenital heart defect and she is asymptomatic and is able to perform daily activities and exercise, I anticipate that she should have a normal and successful pregnancy outcome.  I have arranged for the patient to be seen by the cardio-obstetrics team at [facility] (Dr. Johney Frame and Tobb).  They will perform an echocardiogram and provide management recommendations for the patient's future pregnancy.  She already has an appointment with them on August 24, 2022.  She was advised that the risk of recurrence of congenital heart defects  when a mother is affected is usually between 4% to 6%.  During the preconception period, she was advised to continue taking a daily prenatal vitamin and a daily folic acid to decrease her risk of a fetus with a neural tube defect.  Once she conceives, she should have a first trimester ultrasound performed to confirm her dates.    She should have a cell free DNA test to screen for fetal aneuploidy at between 10 to 12 weeks.  She should be referred to our office for a detailed fetal anatomy scan at around 19 weeks.   We will refer her to [facility] pediatric cardiology (as [facility] is in network with her [facility]) for a fetal echocardiogram.  We will then continue to follow her with growth ultrasounds throughout her pregnancy every 4 to 5 weeks.  She should continue to be seen by the cardio-obstetrics team throughout her future pregnancy.    Based on her history of cardiac surgery, the cardio-obstetrics team should be asked if she will require prophylactic antibiotics at the time of delivery for endocarditis prophylaxis.   A vaginal delivery may be attempted at term unless she develops major cardiac issues.  The patient was advised that amitriptyline can be safely used to treat her IBS during pregnancy.  Lomotil should only be used during pregnancy if absolutely necessary.    At the end of the consultation the patient and her husband stated that all of their questions were answered to their complete satisfaction.    We look forward to managing her future pregnancy with you.

## 2022-07-27 NOTE — Telephone Encounter (Signed)
Pt is scheduled to see Dr. Johney Frame at our [facility] as a new pt for 08/24/22 at 3:40 pm.  Pt was made aware of appt date and time by [facility] Scheduling.

## 2022-07-31 DIAGNOSIS — U071 COVID-19: Secondary | ICD-10-CM | POA: Diagnosis not present

## 2022-08-19 ENCOUNTER — Encounter: Payer: Self-pay | Admitting: Cardiology

## 2022-08-24 ENCOUNTER — Ambulatory Visit: Payer: Commercial Managed Care - PPO | Admitting: Cardiology

## 2022-09-19 NOTE — Progress Notes (Deleted)
Cardio-Obstetrics Clinic  New Evaluation  Date:  09/20/2022   ID:  Shirley Wang, DOB January 12, 1996, MRN [id number]  PCP:  Shirley Dimitri, MD   [facility] Providers Cardiologist:  Shirley Red, MD  Electrophysiologist:  None       Referring MD: Shirley Dimitri, MD   Chief Complaint: congenital heart disease  History of Present Illness:    Shirley Wang is a 27 y.o. female [No obstetric history on file.] who is being seen today for the evaluation of congenital heart disease at the request of Shirley Dimitri, MD.   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 Also received balloon pulmonary valvuloplasty at ~8months old. Last TTE in 08-30-21 with EF 50-55%, normal RV, mild AR, peak pulmonary gradient , moderate PR, patent right pulmonary artery to LA baffle, no PDA color flow.  Was seen by Dr. Cristal Wang in 08-31-2019 where she was doing well at that time. No significant CV symptoms.  Today, ***   Prior CV Studies Reviewed: The following studies were reviewed today: TTE 30-Aug-2021: IMPRESSIONS     1. Left ventricular ejection fraction, by estimation, is 50 to 55%. The  left ventricle has low normal function. The left ventricle demonstrates  regional wall motion abnormalities (see scoring diagram/findings for  description). Left ventricular diastolic   parameters were normal.   2. Right ventricular systolic function is normal. The right ventricular  size is normal.   3. The mitral valve is grossly normal. Mild mitral valve regurgitation.  No evidence of mitral stenosis.   4. The aortic valve is tricuspid. Aortic valve regurgitation is mild.   5. PCongenital PS s/p valvuloplasty. Peak Pulmonic gradient of 17 mm HG.  There are multiple jets of pulmonary insufficiency without a no-flow time.  A right pulmonary baffle is not visualized  well with a diameter greater  than 10 mm and a mean gradient <  5 mmHg, not suggestive of significant Baffle stenosis (right upper  pulmonary vein baffle to left atrium). There is no evidence of  color/spectral Doppler flow consistent with PDA. There is no evidence of  color flow suggestive of residual ASD.      Marland Kitchen The pulmonic valve was abnormal. Pulmonic valve regurgitation is  moderate. Mild pulmonic stenosis.   Comparison(s): EF 60%, asc aorta 37mm, PA mean gradient 8.5 mmHg, peak  16.5. Similary PS gradients with increase PI with normal RV size and  function.   Conclusion(s)/Recommendation(s): Consider either CMR quantification of PI  or closer interval follow up for PI if clinically indicated.  Echo 07/12/2013: Tiny PDA, normal left side. RVOT peak gradient 15 mmHg, mean 8 mmHg. Mild PI. Normal RV size/function. Trivial PS cMRI 06/02/15: Qp:Qs 2.3, small PDA, equivocal PAPVR (right upper lobe draining to SVC). Moderate RV dilation, minimal PS/PR. 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 Findings are consistent with PAPVR from RUL and RML to SVC with Qp/Qs ~ 2. PDA is very small, not hemodynamically significant. Plan: Will discuss with group re: elective surgical repair. Post op echo 11/2015:  1. S/p atrial septal defect patch closure.  2. S/p baffle right pulmonary veins to left atrium.  3. Normal left ventricular systolic function.  4. Pulmonary venous return not well visualized. Echo 11/06/2017: Summary:  1. S/p partial anomalous pulmonary venous connection repair.   2. 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   3. No evidence of pulmonary vein stenosis; anomalous right upper pulmonary vein baffle to left atrium widely patent.   4. Inferior vena cava right-sided.   5. Normal superior vena cava flow profile.   6. Normal main and branch pulmonary arteries.   7.  Normal-size left atrium.   8. Normal-size right atrium.   9. Normal left ventricular cavity size and systolic function.   10. No patent ductus arteriosus.   11. No pericardial effusion.   12. Normal coronary arteries.   13. Normal aorta.   14. Normal aortic valve.   15. Normal, right-sided superior vena cava.   16. Intact interventricular septum.   17. No pericardial effusion.   18. No pulmonary valve stenosis.   19. Normal pulmonary valve.   20. Normal right ventricular cavity size and systolic function.   21. Trivial pulmonary valve insufficiency.  Past Medical History:  Diagnosis Date   Allergic rhinitis    has used zyrtec and nasonex in the past prn   Asthma    occ wheeze with cold and persistent coughs in remote past   COVID-19 11/21/2020   Heart disease    PDA (patent ductus arteriosus)    Pulmonic stenosis, congenital    Cong Valvar PS, ballooned at 11 months    Urinary tract infection 1999    Past Surgical History:  Procedure Laterality Date   CARDIAC SURGERY  11/2015   sinus venosus ASD and PAPVR repair, PDA ligation Shirley Wang CTS at [facility])   PULMONARY VALVULOPLASTY     [facility]   WISDOM TOOTH EXTRACTION     { Click here to update PMH, PSH, OB Hx then refresh note  :1}   OB History   No obstetric history on file.     { Click here to update OB Charting then refresh note  :1}    Current Medications: No outpatient medications have been marked as taking for the 09/21/22 encounter (Appointment) with Shirley Sprague, MD.     Allergies:   Dairy aid [tilactase], Oxycodone-acetaminophen, and Nickel   Social History   Socioeconomic History   Marital status: Married    Spouse name: Shirley Wang   Number of children: 0   Years of education: Chief Operating Officer in Nursing   Highest education level: Not on file  Occupational History   Occupation: Charity fundraiser  Tobacco Use   Smoking status: Never   Smokeless tobacco: Never  Vaping Use   Vaping Use: Never used  Substance and Sexual  Activity   Alcohol use: Yes    Comment: social, never more than 3   Drug use: No   Sexual activity: Yes    Birth control/protection: Pill  Other Topics Concern   Not on file  Social History Narrative   09/07/19   From: the area   Living: with husband [name]   Work: Engineer, 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      Family: parents nearby and good relationship      Enjoys: fishing, tennis      Exercise: not currently   Diet: low sodium due to heart disease      Safety   Seat belts: Yes    Guns: Yes  and secure   Safe in relationships: yes   Social Determinants of Corporate investment banker Strain: Not on file  Food Insecurity: Not on file  Transportation Needs: Not  on file  Physical Activity: Not on file  Stress: Not on file  Social Connections: Not on file  { Click here to update SDOH then refresh :1}    Family History  Problem Relation Age of Onset   Hypertension Father    Crohn's disease Father    Diabetes Father        pre   Asthma Brother    Breast cancer Maternal Grandmother 48   Heart disease Neg Hx    Stroke Neg Hx    { Click here to update FH then refresh note    :1}   ROS:   Please see the history of present illness.    *** All other systems reviewed and are negative.   Labs/EKG Reviewed:    EKG:   EKG is *** ordered today.  The ekg ordered today demonstrates ***  Recent Labs: 12/04/2021: ALT 12; BUN 10; Creatinine, Ser 0.79; Hemoglobin 13.6; Platelets 282.0; Potassium 4.2; Sodium 140; TSH 1.33   Recent Lipid Panel Lab Results  Component Value Date/Time   CHOL 172 12/04/2021 11:15 AM   TRIG 154.0 (H) 12/04/2021 11:15 AM   HDL 88.10 12/04/2021 11:15 AM   CHOLHDL 2 12/04/2021 11:15 AM   LDLCALC 53 12/04/2021 11:15 AM    Physical Exam:    VS:  There were no vitals taken for this visit.    Wt Readings from Last 3 Encounters:  12/04/21 191 lb (86.6 kg)  11/30/20 186 lb 8 oz (84.6 kg)  11/24/19 181 lb 8 oz (82.3 kg)     GEN: *** Well  nourished, well developed in no acute distress HEENT: Normal NECK: No JVD; No carotid bruits LYMPHATICS: No lymphadenopathy CARDIAC: ***RRR, no murmurs, rubs, gallops RESPIRATORY:  Clear to auscultation without rales, wheezing or rhonchi  ABDOMEN: Soft, non-tender, non-distended MUSCULOSKELETAL:  No edema; No deformity  SKIN: Warm and dry NEUROLOGIC:  Alert and oriented x 3 PSYCHIATRIC:  Normal affect    Risk Assessment/Risk Calculators:   { Click to calculate CARPREG II - THEN refresh note :1}    { Click to caclulate Mod WHO Class of CV Risk - THEN refresh note :1}     { Click for CHADS2VASc Score - THEN Refresh Note    :[id number]}      ASSESSMENT & PLAN:    #History of sinus venosus ASD s/p repair #History of partial anomalous pulmonary venous return s/p repair #History of PDA s/p repair #History of pulmonic balloon valvuloplasty as infant -Had repair in 2017 with Dr. Jinger Neighbors excellent results and improvement in RV dilation -Currently doing well without significant symptoms -TTE 08/2021 with no residual ASD, PDA or evidence of baffle stenosis -Had moderate PR with mean gradient which will be well tolerated in pregnancy -Can consider CMR to better quantify PR once no longer pregnant -Recommend ASA at 12 weeks There are no Patient Instructions on file for this visit.   Dispo:  No follow-ups on file.   Medication Adjustments/Labs and Tests Ordered: Current medicines are reviewed at length with the patient today.  Concerns regarding medicines are outlined above.  Tests Ordered: No orders of the defined types were placed in this encounter.  Medication Changes: No orders of the defined types were placed in this encounter.

## 2022-09-21 ENCOUNTER — Encounter: Payer: Self-pay | Admitting: Cardiology

## 2022-09-21 ENCOUNTER — Ambulatory Visit (INDEPENDENT_AMBULATORY_CARE_PROVIDER_SITE_OTHER): Payer: Commercial Managed Care - PPO | Admitting: Cardiology

## 2022-09-21 VITALS — BP 130/92 | HR 91 | Ht 64.9 in | Wt 197.0 lb

## 2022-09-21 DIAGNOSIS — I370 Nonrheumatic pulmonary valve stenosis: Secondary | ICD-10-CM | POA: Diagnosis not present

## 2022-09-21 DIAGNOSIS — Z8774 Personal history of (corrected) congenital malformations of heart and circulatory system: Secondary | ICD-10-CM

## 2022-09-21 DIAGNOSIS — Z9889 Other specified postprocedural states: Secondary | ICD-10-CM | POA: Diagnosis not present

## 2022-09-21 DIAGNOSIS — Z8679 Personal history of other diseases of the circulatory system: Secondary | ICD-10-CM | POA: Diagnosis not present

## 2022-09-21 DIAGNOSIS — Q249 Congenital malformation of heart, unspecified: Secondary | ICD-10-CM

## 2022-09-21 NOTE — Patient Instructions (Signed)
Medication Instructions:   Your physician recommends that you continue on your current medications as directed. Please refer to the Current Medication list given to you today.  *If you need a refill on your cardiac medications before your next appointment, please call your pharmacy*   You have been referred to DR. GREGORY FLEMING CONGENITAL CARDIOLOGIST WITH [facility]--DR. FLEMING'S OFFICE WILL BE IN CONTACT WITH YOU SOON TO ARRANGE THIS APPOINTMENT    Follow-Up:  AS NEEDED WITH DR. Shari Prows HERE AT [facility] OR SOONER IF YOU BECOME PREGNANT

## 2022-09-21 NOTE — Progress Notes (Signed)
Cardio-Obstetrics Clinic  New Evaluation  Date:  09/21/2022   ID:  Shirley Wang, DOB 1995-10-09, MRN [id number]  PCP:  Mort Sawyersugal, Tabitha, FNP   [facility] Providers Cardiologist:  Jodelle RedBridgette Christopher, MD  Electrophysiologist:  None       Referring MD: Gweneth Dimitriody, Jessica, MD   Chief Complaint: congenital heart disease  History of Present Illness:    Shirley CootsCasey L Mcalpine is a 27 y.o. female [No obstetric history on file.] who is being seen today for the evaluation of congenital heart disease at the request of Gweneth Dimitriody, Jessica, MD.   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 Also received balloon pulmonary valvuloplasty at ~3411months old. Last TTE in 08/2021 with EF 50-55%, normal RV, mild AR, peak pulmonary gradient 17mmHg, moderate PR, patent right pulmonary artery to LA baffle, no PDA color flow.  Was seen by Dr. Cristal Deerhristopher in 08/2019 where she was doing well at that time. No significant CV symptoms.  She is going through preconception counseling.  Today, denies any cardiovascular symptoms (no chest pain, palpitations, dyspnea on exertion, leg swelling) since her surgery in 2017. She is active without exertional symptoms. She is hoping to become pregnant in the next year and is hoping to undergo preconception counseling.   Prior CV Studies Reviewed: The following studies were reviewed today: TTE 08/2021: IMPRESSIONS     1. Left ventricular ejection fraction, by estimation, is 50 to 55%. The  left ventricle has low normal function. The left ventricle demonstrates  regional wall motion abnormalities (see scoring diagram/findings for  description). Left ventricular diastolic   parameters were normal.   2. Right ventricular systolic function is normal. The right ventricular  size is normal.   3. The mitral valve is grossly normal. Mild mitral valve  regurgitation.  No evidence of mitral stenosis.   4. The aortic valve is tricuspid. Aortic valve regurgitation is mild.   5. PCongenital PS s/p valvuloplasty. Peak Pulmonic gradient of 17 mm HG.  There are multiple jets of pulmonary insufficiency without a no-flow time.  A right pulmonary baffle is not visualized well with a diameter greater  than 10 mm and a mean gradient <  5 mmHg, not suggestive of significant Baffle stenosis (right upper  pulmonary vein baffle to left atrium). There is no evidence of  color/spectral Doppler flow consistent with PDA. There is no evidence of  color flow suggestive of residual ASD.      Marland Kitchen. The pulmonic valve was abnormal. Pulmonic valve regurgitation is  moderate. Mild pulmonic stenosis.   Comparison(s): EF 60%, asc aorta 37mm, PA mean gradient 8.5 mmHg, peak  16.5. Similary PS gradients with increase PI with normal RV size and  function.   Conclusion(s)/Recommendation(s): Consider either CMR quantification of PI  or closer interval follow up for PI if clinically indicated.  Echo 07/12/2013: Tiny PDA, normal left side. RVOT peak gradient 15 mmHg, mean 8 mmHg. Mild PI. Normal RV size/function. Trivial PS cMRI 06/02/15: Qp:Qs 2.3, small PDA, equivocal PAPVR (right upper lobe draining to SVC). Moderate RV dilation, minimal PS/PR. 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 Findings are consistent with PAPVR from RUL and RML to SVC with Qp/Qs ~ 2. PDA is very small, not hemodynamically significant. Plan: Will discuss with  group re: elective surgical repair. Post op echo 11/2015:  1. S/p atrial septal defect patch closure.  2. S/p baffle right pulmonary veins to left atrium.  3. Normal left ventricular systolic function.  4. Pulmonary venous return not well visualized. Echo 11/06/2017: Summary:   1. S/p partial anomalous pulmonary venous connection  repair.   2. 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   3. No evidence of pulmonary vein stenosis; anomalous right upper pulmonary vein baffle to left atrium widely patent.   4. Inferior vena cava right-sided.   5. Normal superior vena cava flow profile.   6. Normal main and branch pulmonary arteries.   7. Normal-size left atrium.   8. Normal-size right atrium.   9. Normal left ventricular cavity size and systolic function.   10. No patent ductus arteriosus.   11. No pericardial effusion.   12. Normal coronary arteries.   13. Normal aorta.   14. Normal aortic valve.   15. Normal, right-sided superior vena cava.   16. Intact interventricular septum.   17. No pericardial effusion.   18. No pulmonary valve stenosis.   19. Normal pulmonary valve.   20. Normal right ventricular cavity size and systolic function.   21. Trivial pulmonary valve insufficiency.  Past Medical History:  Diagnosis Date   Allergic rhinitis    has used zyrtec and nasonex in the past prn   Asthma    occ wheeze with cold and persistent coughs in remote past   COVID-19 11/21/2020   Heart disease    PDA (patent ductus arteriosus)    Pulmonic stenosis, congenital    Cong Valvar PS, ballooned at 11 months    Urinary tract infection 1999    Past Surgical History:  Procedure Laterality Date   CARDIAC SURGERY  11/2015   sinus venosus ASD and PAPVR repair, PDA ligation Arvilla Market CTS at [facility])   PULMONARY VALVULOPLASTY     [facility]   WISDOM TOOTH EXTRACTION        OB History   No obstetric history on file.         Current Medications: Current Meds  Medication Sig   amitriptyline (ELAVIL) 10 MG tablet TAKE ONE TABLET BY MOUTH EVERY NIGHT AT BEDTIME   diphenoxylate-atropine (LOMOTIL) 2.5-0.025 MG tablet Take 1 tablet by mouth 2 (two) times daily as needed for diarrhea or loose stools.   folic acid (FOLVITE) 800 MCG tablet Take 800 mcg by mouth daily.   prenatal vitamin w/FE, FA (PRENATAL 1 + 1) 27-1 MG  TABS tablet Take 1 tablet by mouth daily at 12 noon.     Allergies:   Dairy aid [tilactase], Oxycodone-acetaminophen, and Nickel   Social History   Socioeconomic History   Marital status: Married    Spouse name: Jarvis Knodel   Number of children: 0   Years of education: Chief Operating Officer in Nursing   Highest education level: Not on file  Occupational History   Occupation: Charity fundraiser  Tobacco Use   Smoking status: Never   Smokeless tobacco: Never  Vaping Use   Vaping Use: Never used  Substance and Sexual Activity   Alcohol use: Yes    Comment: social, never more than 3   Drug use: No   Sexual activity: Yes    Birth control/protection: Pill  Other Topics Concern   Not on file  Social History Narrative   09/07/19   From: the area   Living: with husband [name]   Work: Engineer, 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      Family: parents nearby and good relationship      Enjoys: fishing, tennis      Exercise: not currently   Diet: low sodium due to heart disease      Safety   Seat belts: Yes    Guns: Yes  and secure   Safe in relationships: yes   Social Determinants of Corporate investment bankerHealth   Financial Resource Strain: Not on file  Food Insecurity: Not on file  Transportation Needs: Not on file  Physical Activity: Not on file  Stress: Not on file  Social Connections: Not on file      Family History  Problem Relation Age of Onset   Hypertension Father    Crohn's disease Father    Diabetes Father        pre   Asthma Brother    Breast cancer Maternal Grandmother 48   Heart disease Neg Hx    Stroke Neg Hx       ROS:   Please see the history of present illness.    All other systems reviewed and are negative.   Labs/EKG Reviewed:    EKG:   EKG is ordered today.  The ekg ordered today demonstrates NSR, HR 91  Recent Labs: 12/04/2021: ALT 12; BUN 10; Creatinine, Ser 0.79; Hemoglobin 13.6; Platelets 282.0; Potassium 4.2; Sodium 140; TSH 1.33   Recent Lipid Panel Lab Results  Component Value  Date/Time   CHOL 172 12/04/2021 11:15 AM   TRIG 154.0 (H) 12/04/2021 11:15 AM   HDL 88.10 12/04/2021 11:15 AM   CHOLHDL 2 12/04/2021 11:15 AM   LDLCALC 53 12/04/2021 11:15 AM    Physical Exam:    VS:  BP (!) 130/92   Pulse 91   Ht 5' 4.9" (1.648 m)   Wt 197 lb (89.4 kg)   LMP 09/13/2022   SpO2 99%   BMI 32.88 kg/m     Wt Readings from Last 3 Encounters:  09/21/22 197 lb (89.4 kg)  12/04/21 191 lb (86.6 kg)  11/30/20 186 lb 8 oz (84.6 kg)     GEN:  Well nourished, well developed in no acute distress HEENT: Normal NECK: No JVD; No carotid bruits LYMPHATICS: No lymphadenopathy CARDIAC: RRR, no murmurs, rubs, gallops RESPIRATORY:  Clear to auscultation without rales, wheezing or rhonchi  ABDOMEN: Soft, non-tender, non-distended MUSCULOSKELETAL:  No edema; No deformity  SKIN: Warm and dry NEUROLOGIC:  Alert and oriented x 3 PSYCHIATRIC:  Normal affect    Risk Assessment/Risk Calculators:    CARPREG II Risk Prediction Index Score:  3.  The patient's risk for a primary cardiac event is 15%.  Modified World Health Organization [place](WHO) Classification of Maternal CV Risk   Class II     ASSESSMENT & PLAN:    #History of sinus venosus ASD s/p repair #History of partial anomalous pulmonary venous return s/p repair #History of PDA s/p repair #History of pulmonic balloon valvuloplasty as infant -Had repair in 2017 with Dr. Jinger NeighborsMill eith excellent results and improvement in RV dilation -Currently doing well without significant symptoms -TTE 08/2021 with no residual ASD, PDA or evidence of baffle stenosis, RV with normal function -Had moderate PR with mean gradient 17mmHg which will be well tolerated in pregnancy -Will refer to Dr. Mayo AoFlemming for follow-up for congenital heart disease and they can repeat TTE at their office -Overall, WHO II, CARPREG score 3 (15% risk of CV events); no contraindication for pregnancy -Recommend ASA at 12 weeks  Patient Instructions  Medication  Instructions:   Your physician recommends that you continue on your current medications as directed. Please refer to the Current Medication list given to you today.  *If you need a refill on your cardiac medications before your next appointment, please call your pharmacy*   You have been referred to DR. GREGORY FLEMING CONGENITAL CARDIOLOGIST WITH [facility]--DR. FLEMING'S OFFICE WILL BE IN CONTACT WITH YOU SOON TO ARRANGE THIS APPOINTMENT    Follow-Up:  AS NEEDED WITH DR. Shari Prows HERE AT [facility] OR SOONER IF YOU BECOME PREGNANT    Dispo:  No follow-ups on file.   Medication Adjustments/Labs and Tests Ordered: Current medicines are reviewed at length with the patient today.  Concerns regarding medicines are outlined above.  Tests Ordered: Orders Placed This Encounter  Procedures   Ambulatory referral to Cardiology   EKG 12-Lead   Medication Changes: No orders of the defined types were placed in this encounter.

## 2022-10-19 ENCOUNTER — Telehealth: Payer: Self-pay | Admitting: *Deleted

## 2022-10-19 NOTE — Telephone Encounter (Signed)
referral to [facility] Received: Today Janett Billow, LPN Schedule for 11-08-22 @ 1:30 with Dr. Cristy Folks Congenital Heart Doctor with [facility]

## 2022-10-21 ENCOUNTER — Encounter: Payer: Self-pay | Admitting: Cardiology

## 2022-11-08 DIAGNOSIS — Q249 Congenital malformation of heart, unspecified: Secondary | ICD-10-CM | POA: Diagnosis not present

## 2022-11-08 DIAGNOSIS — Q2116 Sinus venosus atrial septal defect, unspecified: Secondary | ICD-10-CM | POA: Diagnosis not present

## 2022-11-08 DIAGNOSIS — I370 Nonrheumatic pulmonary valve stenosis: Secondary | ICD-10-CM | POA: Diagnosis not present

## 2022-11-08 DIAGNOSIS — I371 Nonrheumatic pulmonary valve insufficiency: Secondary | ICD-10-CM | POA: Diagnosis not present

## 2022-11-08 DIAGNOSIS — Q263 Partial anomalous pulmonary venous connection: Secondary | ICD-10-CM | POA: Diagnosis not present

## 2022-11-19 ENCOUNTER — Other Ambulatory Visit (HOSPITAL_COMMUNITY): Payer: Self-pay | Admitting: Cardiovascular Disease

## 2022-11-19 DIAGNOSIS — Q263 Partial anomalous pulmonary venous connection: Secondary | ICD-10-CM

## 2022-11-19 DIAGNOSIS — Q249 Congenital malformation of heart, unspecified: Secondary | ICD-10-CM

## 2022-11-19 DIAGNOSIS — I370 Nonrheumatic pulmonary valve stenosis: Secondary | ICD-10-CM

## 2022-11-20 DIAGNOSIS — Q249 Congenital malformation of heart, unspecified: Secondary | ICD-10-CM | POA: Diagnosis not present

## 2022-12-01 LAB — HM PAP SMEAR

## 2022-12-05 ENCOUNTER — Encounter: Payer: Commercial Managed Care - PPO | Admitting: Family

## 2022-12-14 ENCOUNTER — Other Ambulatory Visit: Payer: Self-pay

## 2022-12-14 DIAGNOSIS — K580 Irritable bowel syndrome with diarrhea: Secondary | ICD-10-CM

## 2022-12-14 NOTE — Telephone Encounter (Signed)
Received request for diphenoxylate-atropine (LOMOTIL) 2.5-0.025 MG tablet  from pharmacy. See below message from pharmacy.     Patient last seen by DR. [name] 12/05/22 for CPE  Had a TOC with Dugal on 12/05/22 but cancelled and rescheduled for 02/04/23 ok to send refill as pended.

## 2022-12-17 NOTE — Telephone Encounter (Signed)
Pt needs to be seen prior to refill as this is a controlled substance and typically when used long term will be followed by GI. Goal is short term with lomotil.

## 2022-12-17 NOTE — Telephone Encounter (Signed)
Called and spoke with patient she states she does not use this medication very often, but she likes to have it on hand. She can wait to have it refilled until her appt in August.

## 2022-12-19 ENCOUNTER — Other Ambulatory Visit: Payer: Self-pay | Admitting: Family

## 2022-12-19 ENCOUNTER — Other Ambulatory Visit: Payer: Self-pay

## 2022-12-19 ENCOUNTER — Telehealth: Payer: Self-pay

## 2022-12-19 DIAGNOSIS — K580 Irritable bowel syndrome with diarrhea: Secondary | ICD-10-CM

## 2022-12-19 NOTE — Telephone Encounter (Signed)
Patient returned call stating that she does use the [facility] pharmacy now,however she doesn't need the refill of this medication.She has an appointment in August with tabitha and she will let her fill it then since its an as needed medication.

## 2022-12-19 NOTE — Telephone Encounter (Signed)
Received faxed refill request for Diphenoxylate-atrop 2.5-0.025 mg from [facility].  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  Need to know if pt needs this refill and if so where she would like it sent.   Left a message to call office back.

## 2022-12-19 NOTE — Telephone Encounter (Signed)
Prescription Request  12/19/2022  LOV: Visit date not found  What is the name of the medication or equipment?  amitriptyline (ELAVIL) 10 MG tablet  Have you contacted your pharmacy to request a refill? No   Which pharmacy would you like this sent to?   [id number] [place], [place] - [address] [place] [address] Phone: [phone] Fax: [phone]    Patient notified that their request is being sent to the clinical staff for review and that they should receive a response within 2 business days.   Please advise at Mobile [phone] (mobile)

## 2022-12-21 NOTE — Telephone Encounter (Signed)
Verified per phone note that she does want it sent to [facility]. Script was given by Dr. [name] in past ok to refill.

## 2022-12-23 NOTE — Telephone Encounter (Signed)
What is pt taking elavil for? I do not see a dx on her list on what she might be taking this for. Hyperhydrosis?

## 2022-12-24 ENCOUNTER — Other Ambulatory Visit: Payer: Self-pay

## 2022-12-24 DIAGNOSIS — K580 Irritable bowel syndrome with diarrhea: Secondary | ICD-10-CM

## 2022-12-24 NOTE — Addendum Note (Signed)
Addended by: Damita Lack on: 12/24/2022 01:19 PM   Modules accepted: Orders

## 2022-12-24 NOTE — Telephone Encounter (Signed)
Spoke with Baird Lyons.  She states it was originally prescribed by Dr. Arsenio Katz for IBS.  She states she takes one tablet daily.

## 2022-12-24 NOTE — Telephone Encounter (Signed)
Patient has upcomming TOC appointment in August. Ok to fill as pended.

## 2022-12-25 MED ORDER — AMITRIPTYLINE HCL 10 MG PO TABS
10.0000 mg | ORAL_TABLET | Freq: Every day | ORAL | 0 refills | Status: DC
Start: 2022-12-25 — End: 2023-03-25

## 2022-12-25 NOTE — Telephone Encounter (Signed)
Left message to return call to our office.  

## 2022-12-25 NOTE — Telephone Encounter (Signed)
Patient returned call and stated that she spoke to [facility] yesterday regarding this,and this is what donna wrote on the other telephone encounter  " Spoke with Baird Lyons. She states it was originally prescribed by Dr. Arsenio Katz for IBS. She states she takes one tablet daily. "

## 2022-12-25 NOTE — Telephone Encounter (Signed)
I believe this is the second open encounter on this.  I have asked prior to refill, what is pt taking elavil for? I do not see an indication on her record of why?   Depression? Migraines?

## 2022-12-25 NOTE — Addendum Note (Signed)
Addended by: Mort Sawyers on: 12/25/2022 07:23 AM   Modules accepted: Orders

## 2023-01-16 ENCOUNTER — Encounter (INDEPENDENT_AMBULATORY_CARE_PROVIDER_SITE_OTHER): Payer: Self-pay

## 2023-02-04 ENCOUNTER — Ambulatory Visit: Payer: Commercial Managed Care - PPO | Admitting: Family

## 2023-02-04 ENCOUNTER — Encounter: Payer: Self-pay | Admitting: Family

## 2023-02-04 VITALS — BP 132/78 | HR 89 | Temp 98.1°F | Ht 64.9 in | Wt 198.6 lb

## 2023-02-04 DIAGNOSIS — E781 Pure hyperglyceridemia: Secondary | ICD-10-CM

## 2023-02-04 DIAGNOSIS — E669 Obesity, unspecified: Secondary | ICD-10-CM

## 2023-02-04 DIAGNOSIS — E538 Deficiency of other specified B group vitamins: Secondary | ICD-10-CM | POA: Diagnosis not present

## 2023-02-04 DIAGNOSIS — L659 Nonscarring hair loss, unspecified: Secondary | ICD-10-CM | POA: Insufficient documentation

## 2023-02-04 DIAGNOSIS — Z87898 Personal history of other specified conditions: Secondary | ICD-10-CM

## 2023-02-04 DIAGNOSIS — R6889 Other general symptoms and signs: Secondary | ICD-10-CM | POA: Diagnosis not present

## 2023-02-04 DIAGNOSIS — Z0000 Encounter for general adult medical examination without abnormal findings: Secondary | ICD-10-CM | POA: Diagnosis not present

## 2023-02-04 LAB — COMPREHENSIVE METABOLIC PANEL
ALT: 12 U/L (ref 0–35)
AST: 16 U/L (ref 0–37)
Albumin: 4.5 g/dL (ref 3.5–5.2)
Alkaline Phosphatase: 68 U/L (ref 39–117)
BUN: 11 mg/dL (ref 6–23)
CO2: 28 mEq/L (ref 19–32)
Calcium: 9.5 mg/dL (ref 8.4–10.5)
Chloride: 103 mEq/L (ref 96–112)
Creatinine, Ser: 0.62 mg/dL (ref 0.40–1.20)
GFR: 121.56 mL/min (ref >60.00)
Glucose, Bld: 91 mg/dL (ref 70–99)
Potassium: 4.1 mEq/L (ref 3.5–5.1)
Sodium: 139 mEq/L (ref 135–145)
Total Bilirubin: 0.4 mg/dL (ref 0.2–1.2)
Total Protein: 7.1 g/dL (ref 6.0–8.3)

## 2023-02-04 LAB — LIPID PANEL
Cholesterol: 161 mg/dL (ref 0–200)
HDL: 56.9 mg/dL (ref >39.00)
LDL Cholesterol: 65 mg/dL (ref 0–99)
NonHDL: 103.86
Total CHOL/HDL Ratio: 3
Triglycerides: 195 mg/dL — ABNORMAL HIGH (ref 0.0–149.0)
VLDL: 39 mg/dL (ref 0.0–40.0)

## 2023-02-04 LAB — CBC
HCT: 42.3 % (ref 36.0–46.0)
Hemoglobin: 13.8 g/dL (ref 12.0–15.0)
MCHC: 32.8 g/dL (ref 30.0–36.0)
MCV: 89.6 fl (ref 78.0–100.0)
Platelets: 278 10*3/uL (ref 150.0–400.0)
RBC: 4.72 Mil/uL (ref 3.87–5.11)
RDW: 12.7 % (ref 11.5–15.5)
WBC: 7.5 10*3/uL (ref 4.0–10.5)

## 2023-02-04 LAB — VITAMIN B12: Vitamin B-12: 687 pg/mL (ref 211–911)

## 2023-02-04 LAB — TSH: TSH: 1.17 u[IU]/mL (ref 0.35–5.50)

## 2023-02-04 LAB — T4, FREE: Free T4: 0.8 ng/dL (ref 0.60–1.60)

## 2023-02-04 MED ORDER — DEXAMETHASONE 1 MG PO TABS
ORAL_TABLET | ORAL | 0 refills | Status: DC
Start: 2023-02-04 — End: 2023-03-27

## 2023-02-04 NOTE — Assessment & Plan Note (Signed)
Pt concerned with symptoms and would like to r/o cushings.  I will also order TSH and cortisol

## 2023-02-04 NOTE — Assessment & Plan Note (Signed)

## 2023-02-04 NOTE — Progress Notes (Unsigned)
Subjective:  Patient ID: Shirley Wang, female    DOB: January 30, 1996  Age: 27 y.o. MRN: [id number]  Patient Care Team: Mort Sawyers, FNP as PCP - General (Family Medicine) Jodelle Red, MD as PCP - Cardiology (Cardiology)   CC:  Chief Complaint  Patient presents with   Transitions Of Care    HPI Shirley Wang is a 27 y.o. female who presents today for an annual physical exam as well as here for transfer of care. She reports consuming a general diet.  Working on a routine for exercise, trying to work on calorie count  She generally feels well. She reports sleeping well. She does have additional problems to discuss today.   Vision:Within last year Dental:Receives regular dental care STD:The patient denies history of sexually transmitted disease.  Last pap: next due 11/30/25  Pt is with acute concerns.  Has noted some hair loss, started around dec 2023.  Hard to lose weight, not gaining weight. Working on calorie counting and trying to work on a diet routine. No joint pains. No dry skin.  She denies large amount of steroid use, only about once a year with poison ivy dermatitis.    Wt Readings from Last 3 Encounters:  02/04/23 198 lb 9.6 oz (90.1 kg)  09/21/22 197 lb (89.4 kg)  12/04/21 191 lb (86.6 kg)         chronic concerns:  Congential heart disease, has had h/o ASD repair with pulmonic stenosis. 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   IBS: on folate acid with some improvement.   Advanced Directives Patient does not have advanced directives   DEPRESSION SCREENING    02/04/2023   10:20 AM 12/04/2021   10:29 AM 11/30/2020   12:28 PM 03/10/2019    7:59 AM 11/04/2017   11:35 AM  PHQ 2/9 Scores  PHQ - 2 Score 0 0 0 0 0     ROS: Negative unless specifically indicated above in HPI.    Current Outpatient Medications:    amitriptyline (ELAVIL) 10 MG tablet, Take 1 tablet (10 mg total) by mouth at bedtime., Disp: 90 tablet, Rfl:  0   dexamethasone (DECADRON) 1 MG tablet, Take one dose po between 11 pm and 12 am for one dose, Disp: 1 tablet, Rfl: 0   diphenoxylate-atropine (LOMOTIL) 2.5-0.025 MG tablet, Take 1 tablet by mouth 2 (two) times daily as needed for diarrhea or loose stools., Disp: 60 tablet, Rfl: 1   folic acid (FOLVITE) 800 MCG tablet, Take 800 mcg by mouth daily., Disp: , Rfl:    prenatal vitamin w/FE, FA (PRENATAL 1 + 1) 27-1 MG TABS tablet, Take 1 tablet by mouth daily at 12 noon., Disp: , Rfl:     Objective:    BP 132/78   Pulse 89   Temp 98.1 F (36.7 C)   Ht 5' 4.9" (1.648 m)   Wt 198 lb 9.6 oz (90.1 kg)   SpO2 97%   BMI 33.15 kg/m   BP Readings from Last 3 Encounters:  02/04/23 132/78  09/21/22 (!) 130/92  12/04/21 122/80      Physical Exam Constitutional:      General: She is not in acute distress.    Appearance: Normal appearance. She is obese. She is not ill-appearing.  HENT:     Head: Normocephalic.     Right Ear: Tympanic membrane normal.     Left Ear: Tympanic membrane normal.     Nose: Nose normal.  Mouth/Throat:     Mouth: Mucous membranes are moist.  Eyes:     Extraocular Movements: Extraocular movements intact.     Pupils: Pupils are equal, round, and reactive to light.  Cardiovascular:     Rate and Rhythm: Normal rate and regular rhythm.  Pulmonary:     Effort: Pulmonary effort is normal.     Breath sounds: Normal breath sounds.  Abdominal:     General: Abdomen is flat. Bowel sounds are normal.     Palpations: Abdomen is soft.     Tenderness: There is no guarding or rebound.  Musculoskeletal:        General: Normal range of motion.     Cervical back: Normal range of motion.  Skin:    General: Skin is warm.     Capillary Refill: Capillary refill takes less than 2 seconds.  Neurological:     General: No focal deficit present.     Mental Status: She is alert.  Psychiatric:        Mood and Affect: Mood normal.        Behavior: Behavior normal.         Thought Content: Thought content normal.        Judgment: Judgment normal.          Assessment & Plan:  High triglycerides Assessment & Plan: Ordered lipid panel, pending results. Work on low cholesterol diet and exercise as tolerated   Orders: -     Lipid panel  Encounter for general adult medical examination without abnormal findings Assessment & Plan: Patient Counseling(The following topics were reviewed):  Preventative care handout given to pt  Health maintenance and immunizations reviewed. Please refer to Health maintenance section. Pt advised on safe sex, wearing seatbelts in car, and proper nutrition labwork ordered today for annual Dental health: Discussed importance of regular tooth brushing, flossing, and dental visits.   Orders: -     TSH -     T4, free -     CBC -     Comprehensive metabolic panel -     Vitamin B12  Cold sensitivity -     TSH -     T4, free -     CBC -     dexAMETHasone; Take one dose po between 11 pm and 12 am for one dose  Dispense: 1 tablet; Refill: 0  Hair loss -     TSH -     T4, free -     CBC -     dexAMETHasone; Take one dose po between 11 pm and 12 am for one dose  Dispense: 1 tablet; Refill: 0  Vitamin B12 deficiency -     Vitamin B12  History of bruising easily Assessment & Plan: Pt concerned with symptoms and would like to r/o cushings.  I will also order TSH and cortisol   Orders: -     dexAMETHasone; Take one dose po between 11 pm and 12 am for one dose  Dispense: 1 tablet; Refill: 0  Obesity (BMI 30-39.9) Assessment & Plan: Pt advised to work on diet and exercise as tolerated   Orders: -     Amb Ref to Medical Weight Management      Follow-up: Return in about 1 year (around 02/04/2024).   Mort Sawyers, FNP

## 2023-02-04 NOTE — Progress Notes (Signed)
noted 

## 2023-02-04 NOTE — Assessment & Plan Note (Signed)
Pt advised to work on diet and exercise as tolerated  

## 2023-02-04 NOTE — Patient Instructions (Addendum)
The overnight test consists of administration of 1 mg of dexamethasone at 11 PM to 12 AM (midnight) and measurement of serum cortisol at 8 AM the next morning  A referral was placed today for weight loss clinic.  Please let us know if you have not heard back within 2 weeks about the referral.  Regards,   Aviyah Swetz FNP-C

## 2023-02-04 NOTE — Assessment & Plan Note (Signed)
Ordered lipid panel, pending results. Work on low cholesterol diet and exercise as tolerated  

## 2023-02-06 ENCOUNTER — Encounter: Payer: Self-pay | Admitting: Family

## 2023-02-06 NOTE — Telephone Encounter (Signed)
Can you send this to [facility] somehow I can not send to them?

## 2023-02-07 ENCOUNTER — Other Ambulatory Visit: Payer: Self-pay

## 2023-02-07 DIAGNOSIS — K580 Irritable bowel syndrome with diarrhea: Secondary | ICD-10-CM

## 2023-02-07 NOTE — Telephone Encounter (Signed)
Prescription pended.

## 2023-02-07 NOTE — Telephone Encounter (Signed)
Patient called in to confirm she  does need the medication refill.

## 2023-02-07 NOTE — Telephone Encounter (Signed)
Received faxed refill request from [facility].  Attempted to reach out to pt to see if refill is needed.  No answer asked pt to call the office.

## 2023-02-07 NOTE — Addendum Note (Signed)
Addended by: Benedict Needy on: 02/07/2023 04:51 PM   Modules accepted: Orders

## 2023-02-08 ENCOUNTER — Encounter (HOSPITAL_COMMUNITY): Payer: Self-pay

## 2023-02-08 MED ORDER — DIPHENOXYLATE-ATROPINE 2.5-0.025 MG PO TABS
1.0000 | ORAL_TABLET | Freq: Two times a day (BID) | ORAL | 1 refills | Status: DC | PRN
Start: 2023-02-08 — End: 2024-01-13

## 2023-02-11 ENCOUNTER — Other Ambulatory Visit: Payer: Self-pay | Admitting: Family

## 2023-02-11 ENCOUNTER — Other Ambulatory Visit (INDEPENDENT_AMBULATORY_CARE_PROVIDER_SITE_OTHER): Payer: Commercial Managed Care - PPO

## 2023-02-11 DIAGNOSIS — Z87898 Personal history of other specified conditions: Secondary | ICD-10-CM

## 2023-02-11 DIAGNOSIS — E669 Obesity, unspecified: Secondary | ICD-10-CM

## 2023-02-11 DIAGNOSIS — L659 Nonscarring hair loss, unspecified: Secondary | ICD-10-CM

## 2023-02-12 ENCOUNTER — Ambulatory Visit (HOSPITAL_BASED_OUTPATIENT_CLINIC_OR_DEPARTMENT_OTHER)
Admission: RE | Admit: 2023-02-12 | Discharge: 2023-02-12 | Disposition: A | Discharge status: Home / Self Care | Payer: Commercial Managed Care - PPO | Source: Ambulatory Visit | Attending: Cardiovascular Disease | Admitting: Cardiovascular Disease

## 2023-02-12 ENCOUNTER — Ambulatory Visit (HOSPITAL_COMMUNITY)
Admission: RE | Admit: 2023-02-12 | Discharge: 2023-02-12 | Disposition: A | Discharge status: Home / Self Care | Payer: Commercial Managed Care - PPO | Source: Ambulatory Visit | Attending: Cardiovascular Disease | Admitting: Cardiovascular Disease

## 2023-02-12 ENCOUNTER — Other Ambulatory Visit (HOSPITAL_COMMUNITY): Payer: Self-pay | Admitting: Cardiovascular Disease

## 2023-02-12 DIAGNOSIS — Q263 Partial anomalous pulmonary venous connection: Secondary | ICD-10-CM

## 2023-02-12 DIAGNOSIS — I370 Nonrheumatic pulmonary valve stenosis: Secondary | ICD-10-CM

## 2023-02-12 DIAGNOSIS — Q249 Congenital malformation of heart, unspecified: Secondary | ICD-10-CM | POA: Diagnosis not present

## 2023-02-12 MED ORDER — GADOBUTROL 1 MMOL/ML IV SOLN
9.0000 mL | Freq: Once | INTRAVENOUS | Status: AC | PRN
  Administered 2023-02-12: 9 mL via INTRAVENOUS

## 2023-02-13 LAB — CORTISOL-AM, BLOOD: Cortisol - AM: 0.9 ug/dL — ABNORMAL LOW

## 2023-03-24 ENCOUNTER — Other Ambulatory Visit: Payer: Self-pay | Admitting: Family

## 2023-03-24 DIAGNOSIS — K580 Irritable bowel syndrome with diarrhea: Secondary | ICD-10-CM

## 2023-03-27 ENCOUNTER — Other Ambulatory Visit: Payer: Self-pay

## 2023-04-03 ENCOUNTER — Encounter: Payer: Self-pay | Admitting: Gastroenterology

## 2023-04-03 ENCOUNTER — Ambulatory Visit: Payer: Commercial Managed Care - PPO | Admitting: Gastroenterology

## 2023-04-03 VITALS — BP 142/92 | HR 87 | Temp 98.5°F | Ht 64.9 in | Wt 197.2 lb

## 2023-04-03 DIAGNOSIS — K580 Irritable bowel syndrome with diarrhea: Secondary | ICD-10-CM | POA: Diagnosis not present

## 2023-04-03 MED ORDER — HYOSCYAMINE SULFATE 0.125 MG SL SUBL
0.1250 mg | SUBLINGUAL_TABLET | Freq: Four times a day (QID) | SUBLINGUAL | 1 refills | Status: DC | PRN
Start: 2023-04-03 — End: 2024-02-26

## 2023-04-03 NOTE — Progress Notes (Unsigned)
Shirley Repress, MD [address]  [address]  [place], [place] [address]  Main: [phone]  Fax: [phone]    Gastroenterology Consultation  Referring Provider:     Mort Sawyers, FNP Primary Care Physician:  Mort Sawyers, FNP Primary Gastroenterologist:  Dr. Arlyss Wang Reason for Consultation: IBS-diarrhea        HPI:   Shirley Wang is a 27 y.o. female referred by Mort Sawyers, FNP  for consultation & management of diarrhea predominant irritable bowel syndrome.  Patient has been managed by Dr. Lavon Paganini, [facility] until 2020 for diarrhea predominant irritable bowel syndrome.  Her symptoms have been fairly under control on amitriptyline 10 mg at bedtime, Lomotil.  She also tried rifaximin with improvement in her symptoms.  Her main concern today is weight gain on amitriptyline as well as discontinuation of Lomotil because she is planning to get pregnant.  She works as a Engineer, civil (consulting) at [employer].  No known history of anemia, normal thyroid.  She reports abdominal bloating and lower abdominal cramps with loose stools, nonbloody.  She does not smoke or drink alcohol  NSAIDs: None  Antiplts/Anticoagulants/Anti thrombotics: None  GI Procedures: None  Past Medical History:  Diagnosis Date   Allergic rhinitis    has used zyrtec and nasonex in the past prn   Asthma    occ wheeze with cold and persistent coughs in remote past   COVID-19 11/21/2020   Heart disease    PDA (patent ductus arteriosus)    Pulmonic stenosis, congenital    Cong Valvar PS, ballooned at 11 months    Urinary tract infection 1999    Past Surgical History:  Procedure Laterality Date   CARDIAC SURGERY  11/2015   sinus venosus ASD and PAPVR repair, PDA ligation [facility] at [facility])   PULMONARY VALVULOPLASTY     [facility]   WISDOM TOOTH EXTRACTION       Current Outpatient Medications:    amitriptyline (ELAVIL) 10 MG tablet, TAKE 1 TABLET BY MOUTH AT BEDTIME, Disp: 90 tablet, Rfl: 0    diphenoxylate-atropine (LOMOTIL) 2.5-0.025 MG tablet, Take 1 tablet by mouth 2 (two) times daily as needed for diarrhea or loose stools., Disp: 60 tablet, Rfl: 1   folic acid (FOLVITE) 800 MCG tablet, Take 800 mcg by mouth daily., Disp: , Rfl:    hyoscyamine (LEVSIN/SL) 0.125 MG SL tablet, Place 1 tablet (0.125 mg total) under the tongue every 6 (six) hours as needed., Disp: 30 tablet, Rfl: 1   prenatal vitamin w/FE, FA (PRENATAL 1 + 1) 27-1 MG TABS tablet, Take 1 tablet by mouth daily at 12 noon., Disp: , Rfl:    Family History  Problem Relation Age of Onset   Breast cancer Mother 39       genetic testing negative   Hypertension Father    Crohn's disease Father    Diabetes type II Father        pre   Asthma Brother    Breast cancer Maternal Grandmother 48   Heart disease Neg Hx    Stroke Neg Hx      Social History   Tobacco Use   Smoking status: Never   Smokeless tobacco: Never  Vaping Use   Vaping status: Never Used  Substance Use Topics   Alcohol use: Yes    Comment: social, never more than 3   Drug use: No    Allergies as of 04/03/2023 - Review Complete 04/03/2023  Allergen Reaction Noted  Dairy aid [tilactase]  11/04/2017   Oxycodone-acetaminophen Nausea And Vomiting 03/10/2019   Nickel Rash 11/30/2015    Review of Systems:    All systems reviewed and negative except where noted in HPI.   Physical Exam:  BP (!) 142/92 (BP Location: Left Arm, Patient Position: Sitting, Cuff Size: Normal)   Pulse 87   Temp 98.5 F (36.9 C) (Oral)   Ht 5' 4.9" (1.648 m)   Wt 197 lb 4 oz (89.5 kg)   BMI 32.93 kg/m  No LMP recorded.  General:   Alert,  Well-developed, well-nourished, pleasant and cooperative in NAD Head:  Normocephalic and atraumatic. Eyes:  Sclera clear, no icterus.   Conjunctiva pink. Ears:  Normal auditory acuity. Nose:  No deformity, discharge, or lesions. Mouth:  No deformity or lesions,oropharynx pink & moist. Neck:  Supple; no masses or  thyromegaly. Lungs:  Respirations even and unlabored.  Clear throughout to auscultation.   No wheezes, crackles, or rhonchi. No acute distress. Heart:  Regular rate and rhythm; no murmurs, clicks, rubs, or gallops. Abdomen:  Normal bowel sounds. Soft, non-tender and non-distended without masses, hepatosplenomegaly or hernias noted.  No guarding or rebound tenderness.   Rectal: Not performed Msk:  Symmetrical without gross deformities. Good, equal movement & strength bilaterally. Pulses:  Normal pulses noted. Extremities:  No clubbing or edema.  No cyanosis. Neurologic:  Alert and oriented x3;  grossly normal neurologically. Skin:  Intact without significant lesions or rashes. No jaundice. Psych:  Alert and cooperative. Normal mood and affect.  Imaging Studies: Reviewed  Assessment and Plan:   Shirley Wang is a 27 y.o. female with history of diarrhea predominant IBS managed on amitriptyline and Lomotil is seen in consultation for IBS diarrhea, establish care  IBS diarrhea: Symptoms are under control Patient is concerned about weight gain, advised her to stop amitriptyline She is planning to get pregnant and prefers not to stay on Lomotil Advised to try Levsin which can be continued during pregnancy Okay to switch from Lomotil to Imodium as needed Check celiac disease panel Offered her Xifaxan if her symptoms are not not under control Contact via MyChart as needed   Follow up as needed   Shirley Repress, MD

## 2023-04-03 NOTE — Patient Instructions (Signed)
Stop the Amitriptyline and Lomotil sent Levsin to your pharmacy to take every 6 hours as needed.

## 2023-04-04 ENCOUNTER — Encounter: Payer: Self-pay | Admitting: Gastroenterology

## 2023-04-04 LAB — CELIAC DISEASE PANEL
Endomysial IgA: NEGATIVE
IgA/Immunoglobulin A, Serum: 320 mg/dL (ref 87–352)
Transglutaminase IgA: <2 U/mL (ref 0–3)

## 2023-06-20 ENCOUNTER — Other Ambulatory Visit: Payer: Self-pay | Admitting: Family

## 2023-06-20 DIAGNOSIS — K580 Irritable bowel syndrome with diarrhea: Secondary | ICD-10-CM

## 2023-07-25 ENCOUNTER — Ambulatory Visit: Payer: 59

## 2023-07-25 ENCOUNTER — Other Ambulatory Visit: Payer: Self-pay

## 2023-07-25 VITALS — BP 132/80 | HR 79 | Ht 64.9 in | Wt 197.6 lb

## 2023-07-25 DIAGNOSIS — Z3201 Encounter for pregnancy test, result positive: Secondary | ICD-10-CM

## 2023-07-25 DIAGNOSIS — O3680X0 Pregnancy with inconclusive fetal viability, not applicable or unspecified: Secondary | ICD-10-CM

## 2023-07-25 DIAGNOSIS — Q249 Congenital malformation of heart, unspecified: Secondary | ICD-10-CM | POA: Diagnosis not present

## 2023-07-25 DIAGNOSIS — Z3A01 Less than 8 weeks gestation of pregnancy: Secondary | ICD-10-CM

## 2023-07-25 DIAGNOSIS — O219 Vomiting of pregnancy, unspecified: Secondary | ICD-10-CM | POA: Diagnosis not present

## 2023-07-25 LAB — POCT PREGNANCY, URINE: Preg Test, Ur: POSITIVE — AB

## 2023-07-25 MED ORDER — ONDANSETRON 4 MG PO TBDP
4.0000 mg | ORAL_TABLET | Freq: Four times a day (QID) | ORAL | 0 refills | Status: DC | PRN
Start: 2023-07-25 — End: 2024-02-26

## 2023-07-25 MED ORDER — PROMETHAZINE HCL 25 MG PO TABS: 25.0000 mg | ORAL_TABLET | Freq: Four times a day (QID) | ORAL | 1 refills | Status: DC | PRN

## 2023-07-25 NOTE — Patient Instructions (Signed)
[facility] Prenatal Care Providers          [facility] locations:  Hours may vary. Please call for an appointment  [facility] at [facility]             [address], [place], [place] [address] [phone]  [facility] at [facility]                                                             [address], [address], [place], [place], [address] [phone]  [facility] at [facility]                                    [address], [address], [place], [place], [address] [phone]  [facility] at [place] [address], [address], [place], [place], [address] [phone]  [facility] at [facility]                                 [address], [place], [place], [address] [phone]  [facility] at [facility]                                    [address], [place], [place], [address] [phone]  [facility] at [facility] [address], [address], [place], [place], [address]                                                   Safe Medications in Pregnancy    Acne: Benzoyl Peroxide Salicylic Acid  Backache/Headache: Tylenol: 2 regular strength every 4 hours OR              2 Extra strength every 6 hours  Colds/Coughs/Allergies: Benadryl (alcohol free) 25 mg every 6 hours as needed Breath right strips Claritin Cepacol throat lozenges Chloraseptic throat spray Cold-Eeze- up to three times per day Cough drops, alcohol free Flonase (by prescription only) Guaifenesin Mucinex Robitussin DM (plain only, alcohol free) Saline nasal spray/drops Sudafed (pseudoephedrine) & Actifed ** use only after [redacted] weeks gestation and if you do not have high blood pressure Tylenol Vicks Vaporub Zinc lozenges Zyrtec   Constipation: Colace Ducolax suppositories Fleet enema Glycerin  suppositories Metamucil Milk of magnesia Miralax Senokot Smooth move tea  Diarrhea: Kaopectate Imodium A-D  *NO pepto Bismol  Hemorrhoids: Anusol Anusol HC Preparation H Tucks  Indigestion: Tums Maalox Mylanta Zantac  Pepcid  Insomnia: Benadryl (alcohol free) 25mg every 6 hours as needed Tylenol PM Unisom, no Gelcaps  Leg Cramps: Tums MagGel  Nausea/Vomiting:  Bonine Dramamine Emetrol Ginger extract Sea bands Meclizine  Nausea medication to take during pregnancy:  Unisom (doxylamine succinate 25 mg tablets) Take one tablet daily at bedtime. If symptoms are not adequately controlled, the dose can be increased to a maximum recommended dose of two   tablets daily (1/2 tablet in the morning, 1/2 tablet mid-afternoon and one at bedtime). Vitamin B6 100mg tablets. Take one tablet twice a day (up to 200 mg per day).  Skin Rashes: Aveeno products Benadryl cream or 25mg every 6 hours as needed Calamine Lotion 1% cortisone cream  Yeast infection: Gyne-lotrimin 7 Monistat 7   **If taking multiple medications, please check labels to avoid duplicating the same active ingredients **take medication as directed on the label ** Do not exceed 4000 mg of tylenol in 24 hours **Do not take medications that contain aspirin or ibuprofen    

## 2023-07-25 NOTE — Progress Notes (Signed)
 History:  [name] is a 28 y.o. No obstetric history on file. who presents to clinic today with complaint of possible pregnancy.   Positive UPT here in clinic PMHx notable for congenital heart disease requiring open heart surgery 2017, gets yearly echos Period was not regular prior to conception, has been off birth control for about 1.5 years  Past Medical History:  Diagnosis Date   Allergic rhinitis    has used zyrtec  and nasonex in the past prn   Asthma    occ wheeze with cold and persistent coughs in remote past   COVID-19 11/21/2020   Heart disease    PDA (patent ductus arteriosus)    Pulmonic stenosis, congenital    Cong Valvar PS, ballooned at 11 months    Urinary tract infection 1999    Past Surgical History:  Procedure Laterality Date   CARDIAC SURGERY  11/2015   sinus venosus ASD and PAPVR repair, PDA ligation [facility] at [facility])   PULMONARY VALVULOPLASTY     [facility]   WISDOM TOOTH EXTRACTION      The following portions of the patient's history were reviewed and updated as appropriate: allergies, current medications, past family history, past medical history, past social history, past surgical history and problem list.   Review of Systems:  Pertinent items noted in HPI and remainder of comprehensive ROS otherwise negative.  Objective:  Physical Exam BP 132/80   Pulse 79   Ht 5' 4.9 (1.648 m)   Wt 197 lb 9.6 oz (89.6 kg)   BMI 32.98 kg/m  Physical Exam Vitals reviewed.  Constitutional:      General: She is not in acute distress.    Appearance: She is well-developed. She is not diaphoretic.  Eyes:     General: No scleral icterus. Pulmonary:     Effort: Pulmonary effort is normal. No respiratory distress.  Skin:    General: Skin is warm and dry.  Neurological:     Mental Status: She is alert.     Coordination: Coordination normal.      Labs and Imaging Results for orders placed or performed in visit on 07/25/23 (from the past 24 hours)   Pregnancy, urine POC     Status: Abnormal   Collection Time: 07/25/23  2:06 PM  Result Value Ref Range   Preg Test, Ur POSITIVE (A) NEGATIVE    No results found.   Assessment & Plan:  1. Positive pregnancy test (Primary) Congratulated, planned and welcome pregnancy Given irregular period will schedule for dating US  Given list of prenatal care locations  2. Nausea/vomiting in pregnancy Starting to have issues with nausea, no meds at home Rx sent for phenergan  After discussion of very small absolute risk increase in cardiac and craniofacial defects rx also sent for zofran  - promethazine  (PHENERGAN ) 25 MG tablet; Take 1 tablet (25 mg total) by mouth every 6 (six) hours as needed for nausea or vomiting.  Dispense: 30 tablet; Refill: 1 - ondansetron  (ZOFRAN -ODT) 4 MG disintegrating tablet; Take 1 tablet (4 mg total) by mouth every 6 (six) hours as needed for nausea.  Dispense: 20 tablet; Refill: 0  3. Pregnancy with uncertain fetal viability, single or unspecified fetus  - US  OB LESS THAN 14 WEEKS WITH OB TRANSVAGINAL; Future  4. Congenital heart disease Discussed she will need to take baby ASA during pregnancy as well as need for fetal echo to evaluate for congenital heart disease in infant   Approximately 15 minutes of total time was  spent with this patient on history taking, coordination of care, education and documentation.   Lola Donnice HERO, MD 07/25/2023 3:01 PM

## 2023-07-25 NOTE — Progress Notes (Signed)
 Pt here today for UPT resulting positive.   Pt denies VB and pain.  Pt reports LMP 06/05/23, EDD 03/11/24, and 7w 1d today.  Medications and allergies reviewed.  Pt reports that this is first baby and that she has a history heart disease.  Dr. Lola recommends dating US .  Pt agreed to appt at [facility] on 07/29/22 at 1430.    Irelynd Zumstein,RN  07/25/23

## 2023-07-30 ENCOUNTER — Encounter (HOSPITAL_COMMUNITY): Payer: Self-pay

## 2023-07-30 ENCOUNTER — Ambulatory Visit (HOSPITAL_COMMUNITY): Admission: RE | Admit: 2023-07-30 | Payer: 59 | Source: Ambulatory Visit

## 2023-07-30 ENCOUNTER — Ambulatory Visit (HOSPITAL_COMMUNITY)
Admission: RE | Admit: 2023-07-30 | Discharge: 2023-07-30 | Disposition: A | Discharge status: Home / Self Care | Payer: 59 | Source: Ambulatory Visit | Attending: Family Medicine | Admitting: Family Medicine

## 2023-07-30 DIAGNOSIS — O3481 Maternal care for other abnormalities of pelvic organs, first trimester: Secondary | ICD-10-CM | POA: Diagnosis not present

## 2023-07-30 DIAGNOSIS — O3680X0 Pregnancy with inconclusive fetal viability, not applicable or unspecified: Secondary | ICD-10-CM | POA: Diagnosis not present

## 2023-07-30 DIAGNOSIS — Z3687 Encounter for antenatal screening for uncertain dates: Secondary | ICD-10-CM | POA: Diagnosis not present

## 2023-07-30 DIAGNOSIS — Z3682 Encounter for antenatal screening for nuchal translucency: Secondary | ICD-10-CM | POA: Diagnosis not present

## 2023-07-30 DIAGNOSIS — Z3A01 Less than 8 weeks gestation of pregnancy: Secondary | ICD-10-CM | POA: Diagnosis not present

## 2023-08-01 ENCOUNTER — Encounter: Payer: Self-pay | Admitting: Family Medicine

## 2023-08-26 DIAGNOSIS — Z3A11 11 weeks gestation of pregnancy: Secondary | ICD-10-CM | POA: Diagnosis not present

## 2023-08-26 DIAGNOSIS — Z3481 Encounter for supervision of other normal pregnancy, first trimester: Secondary | ICD-10-CM | POA: Diagnosis not present

## 2023-08-26 DIAGNOSIS — Z3400 Encounter for supervision of normal first pregnancy, unspecified trimester: Secondary | ICD-10-CM | POA: Diagnosis not present

## 2023-08-26 DIAGNOSIS — Z3685 Encounter for antenatal screening for Streptococcus B: Secondary | ICD-10-CM | POA: Diagnosis not present

## 2023-08-26 DIAGNOSIS — Z3491 Encounter for supervision of normal pregnancy, unspecified, first trimester: Secondary | ICD-10-CM | POA: Diagnosis not present

## 2023-08-26 LAB — OB RESULTS CONSOLE ANTIBODY SCREEN: Antibody Screen: NEGATIVE

## 2023-08-26 LAB — OB RESULTS CONSOLE HEPATITIS B SURFACE ANTIGEN: Hepatitis B Surface Ag: NEGATIVE

## 2023-08-26 LAB — OB RESULTS CONSOLE RUBELLA ANTIBODY, IGM: Rubella: IMMUNE

## 2023-08-26 LAB — HEPATITIS C ANTIBODY: HCV Ab: NEGATIVE

## 2023-08-26 LAB — OB RESULTS CONSOLE HIV ANTIBODY (ROUTINE TESTING): HIV: NONREACTIVE

## 2023-08-26 LAB — OB RESULTS CONSOLE RPR: RPR: NONREACTIVE

## 2023-08-27 ENCOUNTER — Telehealth: Payer: 59

## 2023-09-04 ENCOUNTER — Encounter: Payer: Self-pay | Admitting: Obstetrics & Gynecology

## 2023-09-05 DIAGNOSIS — Z3401 Encounter for supervision of normal first pregnancy, first trimester: Secondary | ICD-10-CM | POA: Diagnosis not present

## 2023-09-05 DIAGNOSIS — Z124 Encounter for screening for malignant neoplasm of cervix: Secondary | ICD-10-CM | POA: Diagnosis not present

## 2023-09-05 DIAGNOSIS — Z3A12 12 weeks gestation of pregnancy: Secondary | ICD-10-CM | POA: Diagnosis not present

## 2023-09-05 DIAGNOSIS — Z3481 Encounter for supervision of other normal pregnancy, first trimester: Secondary | ICD-10-CM | POA: Diagnosis not present

## 2023-09-05 DIAGNOSIS — Z113 Encounter for screening for infections with a predominantly sexual mode of transmission: Secondary | ICD-10-CM | POA: Diagnosis not present

## 2023-09-05 LAB — OB RESULTS CONSOLE GC/CHLAMYDIA
Chlamydia: NEGATIVE
Neisseria Gonorrhea: NEGATIVE

## 2023-10-03 DIAGNOSIS — Z361 Encounter for antenatal screening for raised alphafetoprotein level: Secondary | ICD-10-CM | POA: Diagnosis not present

## 2023-11-04 DIAGNOSIS — G4709 Other insomnia: Secondary | ICD-10-CM | POA: Diagnosis not present

## 2023-11-04 DIAGNOSIS — Z3A21 21 weeks gestation of pregnancy: Secondary | ICD-10-CM | POA: Diagnosis not present

## 2023-11-04 DIAGNOSIS — Z3402 Encounter for supervision of normal first pregnancy, second trimester: Secondary | ICD-10-CM | POA: Diagnosis not present

## 2023-11-04 DIAGNOSIS — Z363 Encounter for antenatal screening for malformations: Secondary | ICD-10-CM | POA: Diagnosis not present

## 2023-11-27 DIAGNOSIS — O35BXX0 Maternal care for other (suspected) fetal abnormality and damage, fetal cardiac anomalies, not applicable or unspecified: Secondary | ICD-10-CM | POA: Diagnosis not present

## 2023-11-27 DIAGNOSIS — Z8279 Family history of other congenital malformations, deformations and chromosomal abnormalities: Secondary | ICD-10-CM | POA: Diagnosis not present

## 2023-12-03 DIAGNOSIS — Z362 Encounter for other antenatal screening follow-up: Secondary | ICD-10-CM | POA: Diagnosis not present

## 2023-12-03 DIAGNOSIS — Z3A25 25 weeks gestation of pregnancy: Secondary | ICD-10-CM | POA: Diagnosis not present

## 2023-12-03 DIAGNOSIS — Z3400 Encounter for supervision of normal first pregnancy, unspecified trimester: Secondary | ICD-10-CM | POA: Diagnosis not present

## 2023-12-25 DIAGNOSIS — Z23 Encounter for immunization: Secondary | ICD-10-CM | POA: Diagnosis not present

## 2023-12-25 DIAGNOSIS — Z3480 Encounter for supervision of other normal pregnancy, unspecified trimester: Secondary | ICD-10-CM | POA: Diagnosis not present

## 2023-12-25 DIAGNOSIS — Z3400 Encounter for supervision of normal first pregnancy, unspecified trimester: Secondary | ICD-10-CM | POA: Diagnosis not present

## 2023-12-25 DIAGNOSIS — Z3A28 28 weeks gestation of pregnancy: Secondary | ICD-10-CM | POA: Diagnosis not present

## 2023-12-31 DIAGNOSIS — O99810 Abnormal glucose complicating pregnancy: Secondary | ICD-10-CM | POA: Diagnosis not present

## 2024-01-13 ENCOUNTER — Encounter (HOSPITAL_BASED_OUTPATIENT_CLINIC_OR_DEPARTMENT_OTHER): Payer: Self-pay | Admitting: Family

## 2024-01-13 ENCOUNTER — Ambulatory Visit (INDEPENDENT_AMBULATORY_CARE_PROVIDER_SITE_OTHER): Admitting: Family

## 2024-01-13 VITALS — BP 116/62 | HR 100 | Ht 64.0 in | Wt 217.0 lb

## 2024-01-13 DIAGNOSIS — R600 Localized edema: Secondary | ICD-10-CM

## 2024-01-13 DIAGNOSIS — Q249 Congenital malformation of heart, unspecified: Secondary | ICD-10-CM | POA: Diagnosis not present

## 2024-01-13 NOTE — Patient Instructions (Signed)
 Medication Instructions:  Continue your current medications *If you need a refill on your cardiac medications before your next appointment, please call your pharmacy*  Testing/Procedures: Your physician has requested that you have an echocardiogram. Echocardiography is a painless test that uses sound waves to create images of your heart. It provides your doctor with information about the size and shape of your heart and how well your heart's chambers and valves are working. This procedure takes approximately one hour. There are no restrictions for this procedure. Please do NOT wear cologne, perfume, aftershave, or lotions (deodorant is allowed). Please arrive 15 minutes prior to your appointment time.  Please note: We ask at that you not bring children with you during ultrasound (echo/ vascular) testing. Due to room size and safety concerns, children are not allowed in the ultrasound rooms during exams. Our front office staff cannot provide observation of children in our lobby area while testing is being conducted. An adult accompanying a patient to their appointment will only be allowed in the ultrasound room at the discretion of the ultrasound technician under special circumstances. We apologize for any inconvenience.   Follow-Up: At [facility], you and your health needs are our priority.  As part of our continuing mission to provide you with exceptional heart care, our providers are all part of one team.  This team includes your primary Cardiologist (physician) and Advanced Practice Providers or APPs (Physician Assistants and Nurse Practitioners) who all work together to provide you with the care you need, when you need it.  Your next appointment:   After echocardiogram   Provider:   With Dr. Sheena at [facility] or Reche GORMAN Finder, NP    We recommend signing up for the patient portal called MyChart.  Sign up information is provided on this After Visit Summary.  MyChart is used  to connect with patients for Virtual Visits (Telemedicine).  Patients are able to view lab/test results, encounter notes, upcoming appointments, etc.  Non-urgent messages can be sent to your provider as well.   To learn more about what you can do with MyChart, go to [email or web address].   Other Instructions  To prevent or reduce lower extremity swelling: Sit with legs elevated. For example, in the recliner or on an ottoman.  Wear knee-high compression stockings during the daytime. Ones labeled 15-20 mmHg provide good compression.

## 2024-01-13 NOTE — Progress Notes (Signed)
 Cardiology Office Note   Date:  01/13/2024  ID:  Shirley Wang, DOB 11-27-1995, MRN [id number] PCP: Corwin Antu, FNP  [facility] Providers Cardiologist:  Shelda Bruckner, MD Cardiology APP:  Vannie Reche RAMAN, NP     History of Present Illness Shirley Wang is a 28 y.o. 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  Also received balloon pulmonary valvuloplasty at approximately 17 months old).  TTE 08/2021 LVEF 50 to 55%, normal RV, mild AR, peak pulmonary gradient 17 mmHg, moderate TR, patent right pulmonary artery to LA baffle, no PDA color flow.  Left seen by Dr. Hobart 09/21/2022 for preconception counseling.  She was referred of Dr. Theotis for follow-up for congenital heart disease.  She was recommended to start aspirin at [redacted] weeks gestation. cMRI and monitor performed, results not available for my review.  Echocardiogram 10/2022, as below, with no pulmonic valve obstruction, mild to moderate PR, normal biventricular systolic function.   She had fetal echo 11/27/2023 with PFO, otherwise unremarkable.  Of noted could not fully assess pulmonary venous return so suggested postnatal evaluation after discharge from nursery and outpatient clinic. She is [redacted] weeks pregnant. No orthopnea, PND. Exertional dyspnea only with more than usual activity which is not bothersome. No chest pain, pressure, tightness. She does not bilateral LE edema (R on occasion more than L) with no color changes, erythema. 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 Occasional lightheadedness with position changes but no near syncope, syncope. Heart rate routinely 90-100s but no palpitations, not significantly increased compared to pre-pregnancy.   ROS: Please see the history of present illness.    All other systems reviewed and  are negative.   Studies Reviewed EKG Interpretation Date/Time:  Monday January 13 2024 08:41:26 EDT Ventricular Rate:  105 PR Interval:  134 QRS Duration:  78 QT Interval:  336 QTC Calculation: 444 R Axis:   23  Text Interpretation: Sinus tachycardia  No acute ST/T wave changes Confirmed by Vannie Reche ([id number]) on 01/13/2024 8:47:31 AM   TTE 11/08/22  Conclusions                                                             - The patient has undergone the following procedures: patch closure of a superior     sinus venosus atrial septal defect and repair of partial anomalous pulmonary venous   connection using a baffle involving the right upper and right middle pulmonary veins.  S/P pulmonary balloon valvuloplasty                                         - No pulmonic valve obstruction.  Mild to moderate pulmonary valve regurgitation.     - The right atrium is mildly dilated.                                       - Mildly dilated right ventricle.                                           -  S/P partial anomalous pulmonary vein repair. RUPV baffle not well seen    Normal biventricular systolic function                                       Findings  PROCEDURE INFORMATION  The image quality was good.   SURGERIES AND INTERVENTIONS  The patient has undergone the following procedures: patch closure of a superior sinus venosus atrial septal defect and repair of partial anomalous pulmonary venous connection using a baffle involving the right upper and right middle pulmonary veins.  S/P   pulmonary balloon valvuloplasty   SEGMENTAL ANATOMY  There is levocardia with atrial situs solitus, D-looped ventricles, normally related great arteries (S,D,S).  There is abdominal situs solitus.   VEINS  The systemic venous anatomy is normal.  S/P partial anomalous pulmonary vein repair. RUPV baffle not well seen   ATRIA  The atrial septum appears intact.  The atrial septum appears intact but a patent  foramen ovale cannot be ruled out.The right atrium is mildly dilated.Normal left atrial size.  There is no atrial septal defect patch leak.   INLETS/ATRIOVENTRICULAR VALVES  The tricuspid valve is normal.  Inadequate tricuspid valve regurgitation to estimate right ventricular systolic pressures.   Tricuspid valve regurgitation peak gradient is at least 21 mmHg.  No tricuspid valve obstruction, there is trace regurgitation.   No mitral valve obstruction, with no regurgitation.  The mitral valve is normal.  VENTRICLES  Mildly dilated right ventricle.  No right ventricular hypertrophy.  Normal right ventricular systolic function.  The septal curvature is normal.  The left ventricular morphology, size and systolic function are normal.  The ventricular septum appears  intact.   OUTLETS/SEMILUNAR VALVES  The right ventricular outflow tract is unobstructed.The left ventricular outflow tract is unobstructed.No pulmonic valve obstruction.  Mild to moderate pulmonary valve regurgitation.  The aortic valve is tricuspid.  No aortic valve obstruction.  The  aortic valve leaflets have normal mobility.  Trace aortic valve regurgitation.  The aortic valve is normal.   CORONARY ARTERIES  Normal origin and proximal course of the left coronary artery by 2D imaging and color Doppler.  Normal origin and proximal course of the right coronary artery by 2D imaging and color Doppler.   GREAT ARTERIES  The aortic root diameter at the sinuses of Valsalva is normal.  Mild dilation of the the ascending aorta.  Normal main pulmonary artery size.  Mildly dilated right pulmonary artery.  The left pulmonary artery is not well visualized.  Left aortic arch.   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  The abdominal aortic flow is normal.  There is no evidence of coarctation of the aorta.  There is no patent ductus arteriosus.    Risk Assessment/Calculations           Physical Exam VS:  BP 116/62   Pulse 100   Ht 5' 4  (1.626 m)   Wt 217 lb (98.4 kg)   LMP 09/13/2022   SpO2 98%   BMI 37.25 kg/m        Wt Readings from Last 3 Encounters:  01/13/24 217 lb (98.4 kg)  07/25/23 197 lb 9.6 oz (89.6 kg)  04/03/23 197 lb 4 oz (89.5 kg)    GEN: Well nourished, well developed in no acute distress NECK: No JVD; No carotid bruits CARDIAC: RRR, no murmurs, rubs, gallops RESPIRATORY:  Clear to auscultation  without rales, wheezing or rhonchi  ABDOMEN: Soft, non-tender, non-distended EXTREMITIES:  Bilateral ankles with non pitting edema; No deformity   ASSESSMENT AND PLAN  Congenital heart disease - partial anomalous pulmonary vein return, PDA, 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Echo 11/2022 with normal biventricular function, mild to moderate PR, no pulmonic valve obstruction.  Now with bilateral LE edema to ankles in setting of pregnancy.  Suspect LE edema due to pregnancy, will update echocardiogram to ensure normal biventricular functioning.  Has already had fetal [facility]. Continue ASA 81mg  daily in pregnancy.  Will route chart to Dr. Sheena for review of cardio-obstetric clinic  Sinus tachycardia - noted by EKG. Reports no significant palpitations. Will request prior monitor 11/2022 from [facility].  Lightheadedness - occasional with position changes. Likely orthostatic element in pregnancy. Encouraged to make position changes slowly, hydrate well, utilize electrolytes.        Dispo: follow up after echocardiogram with Dr. Sheena or Reche GORMAN Finder, NP   Signed, Reche GORMAN Finder, NP -

## 2024-01-22 ENCOUNTER — Other Ambulatory Visit

## 2024-01-31 ENCOUNTER — Ambulatory Visit: Attending: Family

## 2024-01-31 DIAGNOSIS — R600 Localized edema: Secondary | ICD-10-CM | POA: Diagnosis not present

## 2024-01-31 DIAGNOSIS — Q249 Congenital malformation of heart, unspecified: Secondary | ICD-10-CM

## 2024-02-02 LAB — ECHOCARDIOGRAM COMPLETE
AR max vel: 1.9 cm2
AV Area VTI: 1.89 cm2
AV Area mean vel: 1.88 cm2
AV Mean grad: 13 mmHg
AV Peak grad: 23.2 mmHg
AV Vena cont: 0.2 cm
Ao pk vel: 2.41 m/s
Area-P 1/2: 4.79 cm2
MV M vel: 5.14 m/s
MV Peak grad: 105.7 mmHg
P 1/2 time: 452 ms
S' Lateral: 3.1 cm

## 2024-02-03 ENCOUNTER — Ambulatory Visit (HOSPITAL_BASED_OUTPATIENT_CLINIC_OR_DEPARTMENT_OTHER): Payer: Self-pay | Admitting: Family

## 2024-02-18 ENCOUNTER — Encounter: Payer: Self-pay | Admitting: *Deleted

## 2024-02-18 DIAGNOSIS — Z3685 Encounter for antenatal screening for Streptococcus B: Secondary | ICD-10-CM | POA: Diagnosis not present

## 2024-02-18 DIAGNOSIS — R030 Elevated blood-pressure reading, without diagnosis of hypertension: Secondary | ICD-10-CM | POA: Diagnosis not present

## 2024-02-18 LAB — OB RESULTS CONSOLE GBS: GBS: NEGATIVE

## 2024-02-19 ENCOUNTER — Ambulatory Visit (INDEPENDENT_AMBULATORY_CARE_PROVIDER_SITE_OTHER): Payer: Self-pay | Admitting: Pediatrics

## 2024-02-19 DIAGNOSIS — Z7681 Expectant parent(s) prebirth pediatrician visit: Secondary | ICD-10-CM | POA: Insufficient documentation

## 2024-02-19 NOTE — Progress Notes (Signed)
 Prenatal counseling for impending newborn done Parent agrees to vaccine and office policies [phone]

## 2024-02-20 ENCOUNTER — Other Ambulatory Visit (HOSPITAL_COMMUNITY): Payer: Self-pay

## 2024-02-20 ENCOUNTER — Encounter: Payer: Self-pay | Admitting: Cardiology

## 2024-02-20 ENCOUNTER — Other Ambulatory Visit: Payer: Self-pay | Admitting: Cardiology

## 2024-02-20 ENCOUNTER — Ambulatory Visit: Attending: Cardiology | Admitting: Cardiology

## 2024-02-20 VITALS — BP 134/86 | HR 93 | Ht 64.0 in | Wt 226.6 lb

## 2024-02-20 DIAGNOSIS — I77819 Aortic ectasia, unspecified site: Secondary | ICD-10-CM | POA: Diagnosis not present

## 2024-02-20 DIAGNOSIS — Z3A36 36 weeks gestation of pregnancy: Secondary | ICD-10-CM | POA: Diagnosis not present

## 2024-02-20 DIAGNOSIS — I771 Stricture of artery: Secondary | ICD-10-CM | POA: Diagnosis not present

## 2024-02-20 DIAGNOSIS — R030 Elevated blood-pressure reading, without diagnosis of hypertension: Secondary | ICD-10-CM | POA: Diagnosis not present

## 2024-02-20 DIAGNOSIS — E781 Pure hyperglyceridemia: Secondary | ICD-10-CM | POA: Diagnosis not present

## 2024-02-20 MED ORDER — BLOOD PRESSURE MONITOR AUTOMAT DEVI
1.0000 [IU] | Freq: Once | 0 refills | Status: AC
  Filled 2024-02-20: qty 1, 30d supply, fill #0

## 2024-02-20 MED ORDER — NIFEDIPINE ER OSMOTIC RELEASE 30 MG PO TB24
30.0000 mg | ORAL_TABLET | Freq: Every day | ORAL | 3 refills | Status: DC
  Filled 2024-02-20: qty 90, 90d supply, fill #0

## 2024-02-20 NOTE — Patient Instructions (Addendum)
 Medication Instructions:  Your physician has recommended you make the following change in your medication:  START: Nifedipine  30 mg once daily *If you need a refill on your cardiac medications before your next appointment, please call your pharmacy*   Testing/Procedures: Your physician has requested that you have an echocardiogram (Sept 15th, 16th or 17th). Echocardiography is a painless test that uses sound waves to create images of your heart. It provides your doctor with information about the size and shape of your heart and how well your heart's chambers and valves are working. This procedure takes approximately one hour. There are no restrictions for this procedure. Please do NOT wear cologne, perfume, aftershave, or lotions (deodorant is allowed). Please arrive 15 minutes prior to your appointment time.  Please note: We ask at that you not bring children with you during ultrasound (echo/ vascular) testing. Due to room size and safety concerns, children are not allowed in the ultrasound rooms during exams. Our front office staff cannot provide observation of children in our lobby area while testing is being conducted. An adult accompanying a patient to their appointment will only be allowed in the ultrasound room at the discretion of the ultrasound technician under special circumstances. We apologize for any inconvenience.  Your physician has requested that you have a upper extremity arterial duplex. This test is an ultrasound of the arteries in the arms. It looks at arterial blood flow in the arms. Allow one hour for Upper Arterial scans. There are no restrictions or special instructions.  Please note: We ask at that you not bring children with you during ultrasound (echo/ vascular) testing. Due to room size and safety concerns, children are not allowed in the ultrasound rooms during exams. Our front office staff cannot provide observation of children in our lobby area while testing is being  conducted. An adult accompanying a patient to their appointment will only be allowed in the ultrasound room at the discretion of the ultrasound technician under special circumstances. We apologize for any inconvenience.  Follow-Up: At [facility], you and your health needs are our priority.  As part of our continuing mission to provide you with exceptional heart care, our providers are all part of one team.  This team includes your primary Cardiologist (physician) and Advanced Practice Providers or APPs (Physician Assistants and Nurse Practitioners) who all work together to provide you with the care you need, when you need it.  Your next appointment:    9/24 at 11:40a  Provider:   Kardie Tobb, DO

## 2024-02-20 NOTE — Progress Notes (Unsigned)
 Cardio-Obstetrics Clinic  New Evaluation  Date:  02/21/2024   ID:  Shirley Wang, DOB [birth year], MRN [id number]  PCP:  Corwin Antu, FNP   [facility] Providers Cardiologist:  Shelda Bruckner, MD  Electrophysiologist:  None  Cardiology APP:  Vannie Reche RAMAN, NP      Referring MD: Corwin Antu, FNP   Chief Complaint:  I am ok:  History of Present Illness:    Shirley Wang is a 28 y.o. female [G1P0] who is being seen today for the evaluation of aortic aneurysm in pregnancy at the request of Reche Vannie, NP.  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  Also received balloon pulmonary valvuloplasty at approximately 71 months old).   She recently followed up with Reche Vannie, NP in follow-up for cardiovascular care in pregnancy.  An echocardiogram was done which showed slightly worse dilatation of her proximal ascending aorta from 36 mm to 40 mm.   Prior CV Studies Reviewed: The following studies were reviewed today: Echo reviewed   Past Medical History:  Diagnosis Date   Allergic rhinitis    has used zyrtec  and nasonex in the past prn   Asthma    occ wheeze with cold and persistent coughs in remote past   COVID-19 11/21/2020   Heart disease    PDA (patent ductus arteriosus)    Pulmonic stenosis, congenital    Cong Valvar PS, ballooned at 11 months    Urinary tract infection 1999    Past Surgical History:  Procedure Laterality Date   CARDIAC SURGERY  11/2015   sinus venosus ASD and PAPVR repair, PDA ligation [facility])   PULMONARY VALVULOPLASTY     [facility]   WISDOM TOOTH EXTRACTION        OB History     Gravida  1   Para      Term      Preterm      AB      Living         SAB      IAB      Ectopic      Multiple      Live Births                  Current Medications: Current  Meds  Medication Sig   aspirin 81 MG chewable tablet Chew 81 mg by mouth daily.   Blood Pressure Monitoring (BLOOD PRESSURE MONITOR AUTOMAT) DEVI Use at home to monitor blood pressure as directed   famotidine (PEPCID) 20 MG tablet Take 20 mg by mouth daily.   ferrous sulfate 324 MG TBEC Take 324 mg by mouth daily with breakfast.   hyoscyamine  (LEVSIN /SL) 0.125 MG SL tablet Place 1 tablet (0.125 mg total) under the tongue every 6 (six) hours as needed.   NIFEdipine  (PROCARDIA -XL/NIFEDICAL-XL) 30 MG 24 hr tablet Take 1 tablet (30 mg total) by mouth daily.   ondansetron  (ZOFRAN -ODT) 4 MG disintegrating tablet Take 1 tablet (4 mg total) by mouth every 6 (six) hours as needed for nausea.   prenatal vitamin w/FE, FA (PRENATAL 1 + 1) 27-1 MG TABS tablet Take 1 tablet by mouth daily at 12 noon.   promethazine  (PHENERGAN ) 25 MG tablet Take 1 tablet (25 mg total) by mouth every 6 (six) hours as needed for nausea or vomiting.     Allergies:   Dairy aid [tilactase], Nickel, and Oxycodone-acetaminophen   Social History   Socioeconomic History   Marital status: Married    Spouse name: Shirley Wang   Number of children: 0   Years of education: Chief Operating Officer in Nursing   Highest education level: Not on file  Occupational History   Occupation: Teacher, adult education: [employer]    Comment: urology progressive care, teleheatlh  Tobacco Use   Smoking status: Never   Smokeless tobacco: Never  Vaping Use   Vaping status: Never Used  Substance and Sexual Activity   Alcohol use: Yes    Comment: social, never more than 3   Drug use: No   Sexual activity: Yes    Partners: Male    Birth control/protection: Condom  Other Topics Concern   Not on file  Social History Narrative   09/07/19   From: the area   Living: with husband Corporate investment banker   Work: Engineer, 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      Family: parents nearby and good relationship      Enjoys: fishing, tennis      Exercise: not currently   Diet: low sodium  due to heart disease      Safety   Seat belts: Yes    Guns: Yes  and secure   Safe in relationships: yes   Social Drivers of Corporate investment banker Strain: Not on file  Food Insecurity: Not on file  Transportation Needs: Not on file  Physical Activity: Not on file  Stress: Not on file  Social Connections: Unknown (07/31/2022)   Received from [employer]   Social Network    Social Network: Not on file      Family History  Problem Relation Age of Onset   Breast cancer Mother 38       genetic testing negative   Hypertension Father    Crohn's disease Father    Diabetes type II Father        pre   Asthma Brother    Breast cancer Maternal Grandmother 48   Heart disease Neg Hx    Stroke Neg Hx       ROS:   Please see the history of present illness.     All other systems reviewed and are negative.   Labs/EKG Reviewed:    EKG:  EKG not ordered today.    Recent Labs: No results found for requested labs within last 365 days.   Recent Lipid Panel Lab Results  Component Value Date/Time   CHOL 161 02/04/2023 11:10 AM   TRIG 195.0 (H) 02/04/2023 11:10 AM   HDL 56.90 02/04/2023 11:10 AM   CHOLHDL 3 02/04/2023 11:10 AM   LDLCALC 65 02/04/2023 11:10 AM    Physical Exam:    VS:  BP 134/86 (BP Location: Left Arm)   Pulse 93   Ht 5' 4 (1.626 m)   Wt 226 lb 9.6 oz (102.8 kg)   LMP 09/13/2022   SpO2 98%   BMI 38.90 kg/m     Wt Readings from Last 3 Encounters:  02/20/24 226 lb 9.6 oz (102.8 kg)  01/13/24 217 lb (98.4 kg)  07/25/23 197 lb 9.6 oz (89.6 kg)     GEN:  Well nourished, well developed in no acute distress HEENT: Normal NECK: No JVD; No carotid bruits LYMPHATICS: No lymphadenopathy CARDIAC: RRR, no murmurs, rubs, gallops RESPIRATORY:  Clear to auscultation without rales, wheezing or rhonchi  ABDOMEN: Soft, non-tender, non-distended MUSCULOSKELETAL:  No edema; No deformity  SKIN: Warm and dry NEUROLOGIC:  Alert and oriented x 3 PSYCHIATRIC:   Normal affect    Risk Assessment/Risk Calculators:     CARPREG II Risk Prediction Index Score:  3.  The patient's risk for a primary cardiac event is 15%.   Modified World Health Organization [facility]) Classification of Maternal CV Risk   Class I         ASSESSMENT & PLAN:    Recent echocardiogram showed aortic dilatation which has progressed from imaging in 2024 at which time it was 36 mm and now 40 mm.  I do believe she does have gestational hypertension that has developed in her third trimester her blood pressure here in the office was elevated.  She also reported elevated blood pressure at home with significant differential in the systolic blood pressure with both arms.  It would be beneficial to get an upper extremity ultrasound to make sure there is no evidence of any subclavian stenosis and any other issues.  I am starting the patient on nifedipine  30 mg daily.  Will write a prescription for blood pressure cuff as well.  She will take her blood pressure on a daily basis and send me updates in 1 week.  Lipid profile showed evidence of hypertriglyceridemia this can be reassessed in the postpartum period   Patient Instructions  Medication Instructions:  Your physician has recommended you make the following change in your medication:  START: Nifedipine  30 mg once daily *If you need a refill on your cardiac medications before your next appointment, please call your pharmacy*   Testing/Procedures: Your physician has requested that you have an echocardiogram (Sept 15th, 16th or 17th). Echocardiography is a painless test that uses sound waves to create images of your heart. It provides your doctor with information about the size and shape of your heart and how well your heart's chambers and valves are working. This procedure takes approximately one hour. There are no restrictions for this procedure. Please do NOT wear cologne, perfume, aftershave, or lotions (deodorant is  allowed). Please arrive 15 minutes prior to your appointment time.  Please note: We ask at that you not bring children with you during ultrasound (echo/ vascular) testing. Due to room size and safety concerns, children are not allowed in the ultrasound rooms during exams. Our front office staff cannot provide observation of children in our lobby area while testing is being conducted. An adult accompanying a patient to their appointment will only be allowed in the ultrasound room at the discretion of the ultrasound technician under special circumstances. We apologize for any inconvenience.  Your physician has requested that you have a upper extremity arterial duplex. This test is an ultrasound of the arteries in the arms. It looks at arterial blood flow in the arms. Allow one hour for Upper Arterial scans. There are no restrictions or special instructions.  Please note: We ask at that you not bring children with you during ultrasound (echo/ vascular) testing. Due to room size and safety concerns, children are not allowed in the ultrasound rooms during exams. Our front office staff cannot provide observation of children in our lobby area while testing is being conducted. An adult accompanying a patient to their appointment will only be allowed in the ultrasound room at the discretion of the ultrasound technician under special circumstances. We apologize for any inconvenience.  Follow-Up: At [facility], you and your health needs are our priority.  As part of our continuing mission to provide you with exceptional heart care, our providers are all  part of one team.  This team includes your primary Cardiologist (physician) and Advanced Practice Providers or APPs (Physician Assistants and Nurse Practitioners) who all work together to provide you with the care you need, when you need it.  Your next appointment:    9/24 at 11:40a  Provider:   Kinnie Kaupp, DO      Dispo:  No follow-ups on file.    Medication Adjustments/Labs and Tests Ordered: Current medicines are reviewed at length with the patient today.  Concerns regarding medicines are outlined above.  Tests Ordered: Orders Placed This Encounter  Procedures   ECHOCARDIOGRAM COMPLETE   VAS US  UPPER EXTREMITY ARTERIAL DUPLEX   Medication Changes: Meds ordered this encounter  Medications   NIFEdipine  (PROCARDIA -XL/NIFEDICAL-XL) 30 MG 24 hr tablet    Sig: Take 1 tablet (30 mg total) by mouth daily.    Dispense:  90 tablet    Refill:  3   Blood Pressure Monitoring (BLOOD PRESSURE MONITOR AUTOMAT) DEVI    Sig: Use at home to monitor blood pressure as directed    Dispense:  1 each    Refill:  0

## 2024-02-21 ENCOUNTER — Ambulatory Visit (HOSPITAL_COMMUNITY)
Admission: RE | Admit: 2024-02-21 | Discharge: 2024-02-21 | Disposition: A | Discharge status: Home / Self Care | Source: Ambulatory Visit | Attending: Cardiology | Admitting: Cardiology

## 2024-02-21 ENCOUNTER — Other Ambulatory Visit: Payer: Self-pay | Admitting: Cardiology

## 2024-02-21 ENCOUNTER — Encounter: Payer: Self-pay | Admitting: Cardiology

## 2024-02-21 DIAGNOSIS — E781 Pure hyperglyceridemia: Secondary | ICD-10-CM

## 2024-02-21 DIAGNOSIS — I771 Stricture of artery: Secondary | ICD-10-CM

## 2024-02-21 DIAGNOSIS — I77819 Aortic ectasia, unspecified site: Secondary | ICD-10-CM

## 2024-02-21 DIAGNOSIS — R030 Elevated blood-pressure reading, without diagnosis of hypertension: Secondary | ICD-10-CM

## 2024-02-21 DIAGNOSIS — Z3A36 36 weeks gestation of pregnancy: Secondary | ICD-10-CM

## 2024-02-21 LAB — ECHOCARDIOGRAM LIMITED
AR max vel: 2 cm2
AV Area VTI: 1.92 cm2
AV Area mean vel: 1.98 cm2
AV Mean grad: 8 mmHg
AV Peak grad: 14.5 mmHg
Ao pk vel: 1.9 m/s
Area-P 1/2: 3.98 cm2
Est EF: 55
P 1/2 time: 386 ms
S' Lateral: 3.2 cm

## 2024-02-24 ENCOUNTER — Ambulatory Visit (HOSPITAL_COMMUNITY)
Admission: RE | Admit: 2024-02-24 | Discharge: 2024-02-24 | Disposition: A | Discharge status: Home / Self Care | Source: Ambulatory Visit | Attending: Cardiology

## 2024-02-24 ENCOUNTER — Encounter: Payer: Self-pay | Admitting: Cardiology

## 2024-02-24 DIAGNOSIS — I771 Stricture of artery: Secondary | ICD-10-CM | POA: Insufficient documentation

## 2024-02-25 ENCOUNTER — Telehealth: Payer: Self-pay | Admitting: Cardiology

## 2024-02-25 NOTE — Telephone Encounter (Signed)
 Provider requesting callback from Dr. Sheena regarding pt's Aortic Dilation. Please advise

## 2024-02-26 ENCOUNTER — Encounter (HOSPITAL_COMMUNITY): Payer: Self-pay | Admitting: Obstetrics & Gynecology

## 2024-02-26 ENCOUNTER — Encounter (HOSPITAL_COMMUNITY): Payer: Self-pay

## 2024-02-26 NOTE — Anesthesia Preprocedure Evaluation (Addendum)
 Anesthesia Evaluation  Patient identified by MRN, date of birth, ID band Patient awake    Reviewed: Allergy & Precautions, NPO status , Patient's Chart, lab work & pertinent test results  Airway Mallampati: II  TM Distance: >3 FB Neck ROM: Full    Dental no notable dental hx. (+) Teeth Intact, Dental Advisory Given   Pulmonary asthma    Pulmonary exam normal breath sounds clear to auscultation       Cardiovascular hypertension, Pt. on medications Normal cardiovascular exam Rhythm:Regular Rate:Normal  Hx/o PAPVR, Sinus venosus ASD, PDA S/P repair 8 years ago [facility] Balloon pulmonary valvuloplasty at approximately 29 months old Gestational HTN on nifedipine  Recent echocardiogram showed aortic dilatation which has progressed from imaging in 2024 at which time it was 36 mm and now 40 mm.  Echo 02/21/24  1. Left ventricular ejection fraction, by estimation, is 55%. The left  ventricle has normal function. The left ventricle has no regional wall  motion abnormalities. Left ventricular diastolic parameters were normal.   2. Right ventricular systolic function is normal. The right ventricular  size is normal.   3. Left atrial size was moderately dilated.   4. The mitral valve is abnormal. Trivial mitral valve regurgitation. No  evidence of mitral stenosis.   5. Partial fusion of right/left cusps. mean gradient 8 peak 14.5 mmHg AVA  around 2.0 cm2 no no real stenosis. The aortic valve is tricuspid. There  is mild calcification of the aortic valve. There is mild thickening of the  aortic valve. Aortic valve regurgitation is mild. Aortic valve sclerosis is present, with no evidence of aortic valve stenosis.   6. Mean gradient 5 peak 8 mmHg PVA 2.2 cm2. Pulmonic valve regurgitation  is moderate.   7. Aortic dilatation noted. There is moderate dilatation of the ascending  aorta.   8. The inferior vena cava is normal in size with greater than  50%  respiratory variability, suggesting right atrial pressure of 3 mmHg.   EKG 01/13/24 Sinus tachycardia No acute ST/T wave changes  MRA Chest w wo contrast 02/12/2023 1.  RV dilation, mild to moderate.  Mild right atrial dilation.   2.  Mild ascending aortic dilation.   3.  Aberrant right subclavian artery.   4.  Successful PDA repair.   5.  Mild aortic dilation 36 mm.   6. Dilation of the main pulmonary artery. Dilation of the right pulmonary artery.   7. Right upper pulmonary vein baffle with no evidence of baffle stenosis.   8. No evidence of residual ASD, save for QP/QS which may overestimated QP in the setting of pulmonic insufficiency.   9. No evidence of residual drainage of right upper pulmonary vein into the SVC.   10. Mild pulmonic regurgitation with regurgitant fraction 10%    Neuro/Psych negative neurological ROS  negative psych ROS   GI/Hepatic Neg liver ROS,GERD  ,,  Endo/Other  Obesity  Renal/GU negative Renal ROS  negative genitourinary   Musculoskeletal   Abdominal  (+) + obese  Peds  Hematology  (+) Blood dyscrasia, anemia   Anesthesia Other Findings   Reproductive/Obstetrics (+) Pregnancy 37 weeks                              Anesthesia Physical Anesthesia Plan  ASA: 3  Anesthesia Plan: Spinal   Post-op Pain Management: Minimal or no pain anticipated   Induction: Intravenous  PONV Risk Score and Plan: 4 or  greater and Ondansetron , Scopolamine  patch - Pre-op and Treatment may vary due to age or medical condition  Airway Management Planned: Natural Airway  Additional Equipment: ClearSight  Intra-op Plan:   Post-operative Plan:   Informed Consent: I have reviewed the patients History and Physical, chart, labs and discussed the procedure including the risks, benefits and alternatives for the proposed anesthesia with the patient or authorized representative who has indicated his/her understanding  and acceptance.     Dental advisory given  Plan Discussed with: Anesthesiologist and CRNA  Anesthesia Plan Comments:          Anesthesia Quick Evaluation

## 2024-02-26 NOTE — Telephone Encounter (Signed)
 Dr. Duwaine spoke with Dr. Sheena today.

## 2024-02-26 NOTE — Patient Instructions (Signed)
 Shirley Wang  02/26/2024   Your procedure is scheduled on:  02/27/2024  Arrive at 0700 at Entrance C on [employer] at [facility]  and [employer]. You are invited to use the FREE valet parking or use the Visitor's parking deck.  Pick up the phone at the desk and dial [phone].  Call this number if you have problems the morning of surgery: [phone]  Remember:   Do not eat food:(After Midnight) Desps de medianoche.  You may drink clear liquids until  __0500___.  Clear liquids means a liquid you can see thru.  It can have color such as Cola or Kool aid.  Tea is OK and coffee as long as no milk or creamer of any kind.  Take these medicines the morning of surgery with A SIP OF WATER :  Take nifedipine  as prescribed   Do not wear jewelry, make-up or nail polish.  Do not wear lotions, powders, or perfumes. Do not wear deodorant.  Do not shave 48 hours prior to surgery.  Do not bring valuables to the hospital.  [facility] is not   responsible for any belongings or valuables brought to the hospital.  Contacts, dentures or bridgework may not be worn into surgery.  Leave suitcase in the car. After surgery it may be brought to your room.  For patients admitted to the hospital, checkout time is 11:00 AM the day of              discharge.      Please read over the following fact sheets that you were given:     Preparing for Surgery

## 2024-02-26 NOTE — H&P (Signed)
 Shirley Wang is a 28 y.o. female G1 at [redacted]w[redacted]d presenting for primary C/S for [facility] and aortic aneurysm 4 cm.    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  Also received balloon pulmonary valvuloplasty at approximately 52 months old).   ECHO showed slightly worsened dilatation of her proximal ascending aorta from 36 mm (2024) to 40 mm (stable over one mo).  Patient was seen by Dr. Sheena who dx her with [facility] and started Procardia  30 XL every day.    Fetal ECHO was done and read as wnl but will need outpatient evaluation following delivery.   GBS negative.  Low risk NIPT.  OB History     Gravida  1   Para      Term      Preterm      AB      Living         SAB      IAB      Ectopic      Multiple      Live Births             Past Medical History:  Diagnosis Date   Allergic rhinitis    has used zyrtec  and nasonex in the past prn   Asthma    occ wheeze with cold and persistent coughs in remote past   COVID-19 11/21/2020   Heart disease    PDA (patent ductus arteriosus)    Pulmonic stenosis, congenital    Cong Valvar PS, ballooned at 11 months    Urinary tract infection 1999   Past Surgical History:  Procedure Laterality Date   CARDIAC SURGERY  11/2015   sinus venosus ASD and PAPVR repair, PDA ligation [facility] at [facility])   PULMONARY VALVULOPLASTY     [facility]   WISDOM TOOTH EXTRACTION     Family History: family history includes Asthma in her brother; Breast cancer (age of onset: 75) in her maternal grandmother; Breast cancer (age of onset: 79) in her mother; Crohn's disease in her father; Diabetes type II in her father; Hypertension in her father. Social History:  reports that she has never smoked. She has never used smokeless tobacco. She reports current alcohol use. She reports that she does not use drugs.     Maternal Diabetes: No Genetic Screening:  Normal Maternal Ultrasounds/Referrals: Normal Fetal Ultrasounds or other Referrals:  Fetal echo Maternal Substance Abuse:  No Significant Maternal Medications:  Meds include: Other: Procardia  Significant Maternal Lab Results:  Group B Strep negative Number of Prenatal Visits:greater than 3 verified prenatal visits Maternal Vaccinations:TDap Other Comments:  None  Review of Systems Maternal Medical History:  Prenatal complications: PIH.   Prenatal Complications - Diabetes: none.     Last menstrual period 09/13/2022. Maternal Exam:  Abdomen: Patient reports no abdominal tenderness. Fundal height is c/w dates.     Physical Exam Constitutional:      Appearance: Normal appearance.  HENT:     Head: Normocephalic and atraumatic.  Pulmonary:     Effort: Pulmonary effort is normal.  Abdominal:     Palpations: Abdomen is soft.  Musculoskeletal:        General: Normal range of motion.     Cervical back: Normal range of motion.  Skin:    General: Skin is warm and dry.  Neurological:     Mental Status: She is alert and oriented to person, place, and time.  Psychiatric:        Mood and Affect: Mood normal.        Behavior: Behavior normal.     Prenatal labs: ABO, Rh:  A pos Antibody:  Negative Rubella:  Immune RPR:   NR HBsAg:   Negative HIV:   NR GBS:   Negative  Assessment/Plan: 28yo G1 at [redacted]w[redacted]d with GHTN, aortic aneurysm -Patient was counseled re: Dr. Ginette recs for vac assisted second stage vs prim C/S given 4cm aortic aneurysm; elected to have Prim C/S -Patient is counseled re: risk of bleeding, infection, scarring and damage to surrounding structures.  She is informed of steps of procedure as well as postop expectations and limitations.  All questions were answered -Dr. Corinne (anesthesia) was called and informed of patient's hx prior to admission for surgery  Duwaine Blumenthal 02/26/2024, 1:54 PM

## 2024-02-27 ENCOUNTER — Encounter (HOSPITAL_COMMUNITY): Payer: Self-pay | Admitting: Obstetrics & Gynecology

## 2024-02-27 ENCOUNTER — Encounter (HOSPITAL_COMMUNITY)
Admission: RE | Disposition: A | Discharge status: Home / Self Care | Payer: Self-pay | Source: Home / Self Care | Attending: Obstetrics & Gynecology

## 2024-02-27 ENCOUNTER — Other Ambulatory Visit: Payer: Self-pay

## 2024-02-27 ENCOUNTER — Inpatient Hospital Stay (HOSPITAL_COMMUNITY)
Admission: RE | Admit: 2024-02-27 | Discharge: 2024-02-29 | DRG: 788 | Disposition: A | Discharge status: Home / Self Care | Attending: Obstetrics & Gynecology | Admitting: Obstetrics & Gynecology

## 2024-02-27 ENCOUNTER — Inpatient Hospital Stay (HOSPITAL_COMMUNITY): Payer: Self-pay | Admitting: Anesthesiology

## 2024-02-27 ENCOUNTER — Ambulatory Visit: Admitting: Cardiology

## 2024-02-27 DIAGNOSIS — O9962 Diseases of the digestive system complicating childbirth: Secondary | ICD-10-CM | POA: Diagnosis not present

## 2024-02-27 DIAGNOSIS — O134 Gestational [pregnancy-induced] hypertension without significant proteinuria, complicating childbirth: Secondary | ICD-10-CM

## 2024-02-27 DIAGNOSIS — O133 Gestational [pregnancy-induced] hypertension without significant proteinuria, third trimester: Secondary | ICD-10-CM | POA: Diagnosis present

## 2024-02-27 DIAGNOSIS — O9902 Anemia complicating childbirth: Secondary | ICD-10-CM | POA: Diagnosis present

## 2024-02-27 DIAGNOSIS — O9942 Diseases of the circulatory system complicating childbirth: Secondary | ICD-10-CM | POA: Diagnosis present

## 2024-02-27 DIAGNOSIS — Z3A37 37 weeks gestation of pregnancy: Secondary | ICD-10-CM

## 2024-02-27 DIAGNOSIS — Z3A00 Weeks of gestation of pregnancy not specified: Secondary | ICD-10-CM | POA: Diagnosis not present

## 2024-02-27 DIAGNOSIS — Z833 Family history of diabetes mellitus: Secondary | ICD-10-CM

## 2024-02-27 DIAGNOSIS — Z8249 Family history of ischemic heart disease and other diseases of the circulatory system: Secondary | ICD-10-CM

## 2024-02-27 DIAGNOSIS — K219 Gastro-esophageal reflux disease without esophagitis: Secondary | ICD-10-CM | POA: Diagnosis present

## 2024-02-27 DIAGNOSIS — I719 Aortic aneurysm of unspecified site, without rupture: Secondary | ICD-10-CM | POA: Diagnosis not present

## 2024-02-27 DIAGNOSIS — O139 Gestational [pregnancy-induced] hypertension without significant proteinuria, unspecified trimester: Secondary | ICD-10-CM | POA: Diagnosis present

## 2024-02-27 DIAGNOSIS — O99214 Obesity complicating childbirth: Secondary | ICD-10-CM | POA: Diagnosis present

## 2024-02-27 DIAGNOSIS — Z8616 Personal history of COVID-19: Secondary | ICD-10-CM

## 2024-02-27 DIAGNOSIS — Z98891 History of uterine scar from previous surgery: Secondary | ICD-10-CM

## 2024-02-27 LAB — COMPREHENSIVE METABOLIC PANEL WITH GFR
ALT: 14 U/L (ref 0–44)
AST: 22 U/L (ref 15–41)
Albumin: 2.8 g/dL — ABNORMAL LOW (ref 3.5–5.0)
Alkaline Phosphatase: 128 U/L — ABNORMAL HIGH (ref 38–126)
Anion gap: 14 (ref 5–15)
BUN: 7 mg/dL (ref 6–20)
CO2: 19 mmol/L — ABNORMAL LOW (ref 22–32)
Calcium: 8.9 mg/dL (ref 8.9–10.3)
Chloride: 104 mmol/L (ref 98–111)
Creatinine, Ser: 0.54 mg/dL (ref 0.44–1.00)
GFR, Estimated: >60 mL/min (ref >60)
Glucose, Bld: 96 mg/dL (ref 70–99)
Potassium: 3.4 mmol/L — ABNORMAL LOW (ref 3.5–5.1)
Sodium: 137 mmol/L (ref 135–145)
Total Bilirubin: 0.4 mg/dL (ref 0.0–1.2)
Total Protein: 6.1 g/dL — ABNORMAL LOW (ref 6.5–8.1)

## 2024-02-27 LAB — CBC
HCT: 36.2 % (ref 36.0–46.0)
Hemoglobin: 12 g/dL (ref 12.0–15.0)
MCH: 29.5 pg (ref 26.0–34.0)
MCHC: 33.1 g/dL (ref 30.0–36.0)
MCV: 88.9 fL (ref 80.0–100.0)
Platelets: 216 K/uL (ref 150–400)
RBC: 4.07 MIL/uL (ref 3.87–5.11)
RDW: 13.5 % (ref 11.5–15.5)
WBC: 11.4 K/uL — ABNORMAL HIGH (ref 4.0–10.5)
nRBC: 0 % (ref 0.0–0.2)

## 2024-02-27 LAB — TYPE AND SCREEN
ABO/RH(D): A POS
Antibody Screen: NEGATIVE

## 2024-02-27 SURGERY — Surgical Case
Anesthesia: Spinal

## 2024-02-27 MED ORDER — DEXAMETHASONE SODIUM PHOSPHATE 10 MG/ML IJ SOLN
INTRAMUSCULAR | Status: AC
  Filled 2024-02-27: qty 1

## 2024-02-27 MED ORDER — OXYCODONE HCL 5 MG PO TABS
5.0000 mg | ORAL_TABLET | ORAL | Status: DC | PRN
  Administered 2024-02-28 (×2): 5 mg via ORAL
  Administered 2024-02-29: 10 mg via ORAL
  Filled 2024-02-27: qty 1
  Filled 2024-02-27: qty 2
  Filled 2024-02-27: qty 1

## 2024-02-27 MED ORDER — ACETAMINOPHEN 10 MG/ML IV SOLN
INTRAVENOUS | Status: DC | PRN
  Administered 2024-02-27: 1000 mg via INTRAVENOUS

## 2024-02-27 MED ORDER — CEFAZOLIN SODIUM-DEXTROSE 2-4 GM/100ML-% IV SOLN
2.0000 g | INTRAVENOUS | Status: AC
  Administered 2024-02-27: 2 g via INTRAVENOUS

## 2024-02-27 MED ORDER — PHENYLEPHRINE 80 MCG/ML (10ML) SYRINGE FOR IV PUSH (FOR BLOOD PRESSURE SUPPORT)
PREFILLED_SYRINGE | INTRAVENOUS | Status: DC | PRN
  Administered 2024-02-27: 80 ug via INTRAVENOUS
  Administered 2024-02-27: 160 ug via INTRAVENOUS

## 2024-02-27 MED ORDER — FENTANYL CITRATE (PF) 100 MCG/2ML IJ SOLN
INTRAMUSCULAR | Status: AC
  Filled 2024-02-27: qty 2

## 2024-02-27 MED ORDER — DIBUCAINE (PERIANAL) 1 % EX OINT: 1.0000 | TOPICAL_OINTMENT | CUTANEOUS | Status: DC | PRN

## 2024-02-27 MED ORDER — SCOPOLAMINE 1 MG/3DAYS TD PT72
MEDICATED_PATCH | TRANSDERMAL | Status: AC
  Filled 2024-02-27: qty 1

## 2024-02-27 MED ORDER — TRANEXAMIC ACID 1000 MG/10ML IV SOLN
INTRAVENOUS | Status: DC | PRN
  Administered 2024-02-27: 1000 mg via INTRAVENOUS

## 2024-02-27 MED ORDER — SIMETHICONE 80 MG PO CHEW
80.0000 mg | CHEWABLE_TABLET | Freq: Three times a day (TID) | ORAL | Status: DC
  Administered 2024-02-27 – 2024-02-29 (×5): 80 mg via ORAL
  Filled 2024-02-27 (×5): qty 1

## 2024-02-27 MED ORDER — ONDANSETRON HCL 4 MG/2ML IJ SOLN
INTRAMUSCULAR | Status: DC | PRN
  Administered 2024-02-27: 4 mg via INTRAVENOUS

## 2024-02-27 MED ORDER — DEXMEDETOMIDINE HCL IN NACL 80 MCG/20ML IV SOLN
INTRAVENOUS | Status: AC
  Filled 2024-02-27: qty 20

## 2024-02-27 MED ORDER — LACTATED RINGERS IV SOLN: INTRAVENOUS | Status: DC

## 2024-02-27 MED ORDER — POVIDONE-IODINE 10 % EX SWAB: 2.0000 | Freq: Once | CUTANEOUS | Status: DC

## 2024-02-27 MED ORDER — DIPHENHYDRAMINE HCL 25 MG PO CAPS: 25.0000 mg | ORAL_CAPSULE | Freq: Four times a day (QID) | ORAL | Status: DC | PRN

## 2024-02-27 MED ORDER — OXYTOCIN-SODIUM CHLORIDE 30-0.9 UT/500ML-% IV SOLN: INTRAVENOUS | Status: DC | PRN

## 2024-02-27 MED ORDER — SENNOSIDES-DOCUSATE SODIUM 8.6-50 MG PO TABS
2.0000 | ORAL_TABLET | Freq: Every day | ORAL | Status: DC
  Administered 2024-02-28 – 2024-02-29 (×2): 2 via ORAL
  Filled 2024-02-27 (×2): qty 2

## 2024-02-27 MED ORDER — SIMETHICONE 80 MG PO CHEW: 80.0000 mg | CHEWABLE_TABLET | ORAL | Status: DC | PRN

## 2024-02-27 MED ORDER — CEFAZOLIN SODIUM-DEXTROSE 2-4 GM/100ML-% IV SOLN
INTRAVENOUS | Status: AC
  Filled 2024-02-27: qty 100

## 2024-02-27 MED ORDER — COCONUT OIL OIL
1.0000 | TOPICAL_OIL | Status: DC | PRN
  Administered 2024-02-29: 1 via TOPICAL

## 2024-02-27 MED ORDER — DEXAMETHASONE SODIUM PHOSPHATE 10 MG/ML IJ SOLN
INTRAMUSCULAR | Status: DC | PRN
  Administered 2024-02-27: 10 mg via INTRAVENOUS

## 2024-02-27 MED ORDER — WITCH HAZEL-GLYCERIN EX PADS: 1.0000 | MEDICATED_PAD | CUTANEOUS | Status: DC | PRN

## 2024-02-27 MED ORDER — FENTANYL CITRATE (PF) 100 MCG/2ML IJ SOLN
25.0000 ug | INTRAMUSCULAR | Status: DC | PRN
  Administered 2024-02-27: 50 ug via INTRAVENOUS

## 2024-02-27 MED ORDER — OXYTOCIN-SODIUM CHLORIDE 30-0.9 UT/500ML-% IV SOLN
2.5000 [IU]/h | INTRAVENOUS | Status: AC
  Administered 2024-02-27: 2.5 [IU]/h via INTRAVENOUS
  Filled 2024-02-27: qty 500

## 2024-02-27 MED ORDER — SOD CITRATE-CITRIC ACID 500-334 MG/5ML PO SOLN
ORAL | Status: AC
  Filled 2024-02-27: qty 30

## 2024-02-27 MED ORDER — SODIUM CHLORIDE 0.9 % IR SOLN
Status: DC | PRN
  Administered 2024-02-27: 1

## 2024-02-27 MED ORDER — PHENYLEPHRINE 80 MCG/ML (10ML) SYRINGE FOR IV PUSH (FOR BLOOD PRESSURE SUPPORT)
PREFILLED_SYRINGE | INTRAVENOUS | Status: AC
  Filled 2024-02-27: qty 10

## 2024-02-27 MED ORDER — SOD CITRATE-CITRIC ACID 500-334 MG/5ML PO SOLN
30.0000 mL | Freq: Once | ORAL | Status: AC
  Administered 2024-02-27: 30 mL via ORAL

## 2024-02-27 MED ORDER — TRANEXAMIC ACID-NACL 1000-0.7 MG/100ML-% IV SOLN
INTRAVENOUS | Status: AC
  Filled 2024-02-27: qty 100

## 2024-02-27 MED ORDER — FENTANYL CITRATE (PF) 100 MCG/2ML IJ SOLN
INTRAMUSCULAR | Status: DC | PRN
  Administered 2024-02-27: 15 ug via INTRATHECAL

## 2024-02-27 MED ORDER — ACETAMINOPHEN 500 MG PO TABS
1000.0000 mg | ORAL_TABLET | Freq: Four times a day (QID) | ORAL | Status: DC
  Administered 2024-02-27 – 2024-02-29 (×8): 1000 mg via ORAL
  Filled 2024-02-27 (×8): qty 2

## 2024-02-27 MED ORDER — MENTHOL 3 MG MT LOZG: 1.0000 | LOZENGE | OROMUCOSAL | Status: DC | PRN

## 2024-02-27 MED ORDER — FERROUS SULFATE 325 (65 FE) MG PO TABS
325.0000 mg | ORAL_TABLET | ORAL | Status: DC
  Administered 2024-02-29: 325 mg via ORAL
  Filled 2024-02-27: qty 1

## 2024-02-27 MED ORDER — STERILE WATER FOR IRRIGATION IR SOLN
Status: DC | PRN
  Administered 2024-02-27: 1

## 2024-02-27 MED ORDER — OXYTOCIN-SODIUM CHLORIDE 30-0.9 UT/500ML-% IV SOLN
INTRAVENOUS | Status: DC | PRN
  Administered 2024-02-27: 300 mL via INTRAVENOUS

## 2024-02-27 MED ORDER — MORPHINE SULFATE (PF) 0.5 MG/ML IJ SOLN
INTRAMUSCULAR | Status: AC
  Filled 2024-02-27: qty 10

## 2024-02-27 MED ORDER — ZOLPIDEM TARTRATE 5 MG PO TABS: 5.0000 mg | ORAL_TABLET | Freq: Every evening | ORAL | Status: DC | PRN

## 2024-02-27 MED ORDER — ONDANSETRON HCL 4 MG/2ML IJ SOLN
INTRAMUSCULAR | Status: AC
  Filled 2024-02-27: qty 2

## 2024-02-27 MED ORDER — PROPOFOL 500 MG/50ML IV EMUL
INTRAVENOUS | Status: AC
  Filled 2024-02-27: qty 50

## 2024-02-27 MED ORDER — SUCCINYLCHOLINE CHLORIDE 200 MG/10ML IV SOSY
PREFILLED_SYRINGE | INTRAVENOUS | Status: AC
  Filled 2024-02-27: qty 10

## 2024-02-27 MED ORDER — PHENYLEPHRINE HCL-NACL 20-0.9 MG/250ML-% IV SOLN
INTRAVENOUS | Status: DC | PRN
  Administered 2024-02-27: 15 ug/min via INTRAVENOUS

## 2024-02-27 MED ORDER — BUPIVACAINE IN DEXTROSE 0.75-8.25 % IT SOLN
INTRATHECAL | Status: DC | PRN
  Administered 2024-02-27: 1.8 mL via INTRATHECAL

## 2024-02-27 MED ORDER — SCOPOLAMINE 1 MG/3DAYS TD PT72
1.0000 | MEDICATED_PATCH | TRANSDERMAL | Status: DC
  Administered 2024-02-27: 1 mg via TRANSDERMAL

## 2024-02-27 MED ORDER — MORPHINE SULFATE (PF) 0.5 MG/ML IJ SOLN
INTRAMUSCULAR | Status: DC | PRN
  Administered 2024-02-27: .15 mg via INTRATHECAL

## 2024-02-27 MED ORDER — PRENATAL MULTIVITAMIN CH
1.0000 | ORAL_TABLET | Freq: Every day | ORAL | Status: DC
  Administered 2024-02-28 – 2024-02-29 (×2): 1 via ORAL
  Filled 2024-02-27 (×2): qty 1

## 2024-02-27 SURGICAL SUPPLY — 31 items
BENZOIN TINCTURE PRP APPL 2/3 (GAUZE/BANDAGES/DRESSINGS) ×1 IMPLANT
CHLORAPREP W/TINT 26 (MISCELLANEOUS) ×2 IMPLANT
CLAMP UMBILICAL CORD (MISCELLANEOUS) ×1 IMPLANT
CLOTH BEACON ORANGE TIMEOUT ST (SAFETY) ×1 IMPLANT
DERMABOND ADVANCED .7 DNX12 (GAUZE/BANDAGES/DRESSINGS) IMPLANT
DRSG OPSITE POSTOP 4X10 (GAUZE/BANDAGES/DRESSINGS) ×1 IMPLANT
ELECTRODE REM PT RTRN 9FT ADLT (ELECTROSURGICAL) ×1 IMPLANT
EXTRACTOR VACUUM KIWI (MISCELLANEOUS) IMPLANT
GLOVE BIO SURGEON STRL SZ 6 (GLOVE) ×1 IMPLANT
GLOVE BIOGEL PI IND STRL 6 (GLOVE) ×2 IMPLANT
GLOVE BIOGEL PI IND STRL 7.0 (GLOVE) ×1 IMPLANT
GOWN STRL REUS W/TWL LRG LVL3 (GOWN DISPOSABLE) ×2 IMPLANT
KIT ABG SYR 3ML LUER SLIP (SYRINGE) ×1 IMPLANT
MAT PREVALON FULL STRYKER (MISCELLANEOUS) IMPLANT
NDL HYPO 25X5/8 SAFETYGLIDE (NEEDLE) ×1 IMPLANT
NEEDLE HYPO 25X5/8 SAFETYGLIDE (NEEDLE) ×1 IMPLANT
NS IRRIG 1000ML POUR BTL (IV SOLUTION) ×1 IMPLANT
PACK C SECTION WH (CUSTOM PROCEDURE TRAY) ×1 IMPLANT
PAD OB MATERNITY 4.3X12.25 (PERSONAL CARE ITEMS) ×1 IMPLANT
STRIP CLOSURE SKIN 1/2X4 (GAUZE/BANDAGES/DRESSINGS) IMPLANT
SUT CHROMIC 0 CT 1 (SUTURE) IMPLANT
SUT CHROMIC 0 CTX 36 (SUTURE) ×3 IMPLANT
SUT MNCRL 0 VIOLET CTX 36 (SUTURE) IMPLANT
SUT MON AB 2-0 CT1 27 (SUTURE) ×1 IMPLANT
SUT PDS AB 0 CT1 27 (SUTURE) IMPLANT
SUT PLAIN 0 NONE (SUTURE) IMPLANT
SUT VIC AB 0 CT1 36 (SUTURE) IMPLANT
SUT VIC AB 4-0 KS 27 (SUTURE) IMPLANT
TOWEL OR 17X24 6PK STRL BLUE (TOWEL DISPOSABLE) ×1 IMPLANT
TRAY FOLEY W/BAG SLVR 14FR LF (SET/KITS/TRAYS/PACK) IMPLANT
WATER STERILE IRR 1000ML POUR (IV SOLUTION) ×1 IMPLANT

## 2024-02-27 NOTE — Addendum Note (Signed)
 Addendum  created 02/27/24 1638 by Steen Clarita CROME, CRNA   Intraprocedure Event edited

## 2024-02-27 NOTE — Anesthesia Procedure Notes (Addendum)
 Spinal  Patient location during procedure: OR Start time: 02/27/2024 9:34 AM End time: 02/27/2024 9:36 AM Reason for block: surgical anesthesia Staffing Performed: anesthesiologist  Anesthesiologist: Jerrye Sharper, MD Performed by: Jerrye Sharper, MD Authorized by: Jerrye Sharper, MD   Preanesthetic Checklist Completed: patient identified, IV checked, site marked, risks and benefits discussed, surgical consent, monitors and equipment checked, pre-op evaluation and timeout performed Spinal Block Patient position: sitting Prep: DuraPrep and site prepped and draped Patient monitoring: heart rate, cardiac monitor, continuous pulse ox and blood pressure Approach: midline Location: L4-5 Injection technique: single-shot Needle Needle type: Pencan  Needle gauge: 24 G Needle length: 9 cm Needle insertion depth: 7 cm Assessment Sensory level: T4 Events: CSF return Additional Notes Patient tolerated procedure well. Adequate sensory level.

## 2024-02-27 NOTE — Op Note (Addendum)
 Shirley Wang PROCEDURE DATE: 02/27/2024  PREOPERATIVE DIAGNOSIS: Intrauterine pregnancy at  [redacted]w[redacted]d weeks gestation, gestational hypertension, 4 cm aortic aneurysm  POSTOPERATIVE DIAGNOSIS: The same  PROCEDURE:  Primary Low Transverse Cesarean Section  SURGEON:  Dr. Duwaine Blumenthal  INDICATIONS: Shirley Wang is a 28 y.o. G1P0 at [redacted]w[redacted]d scheduled for cesarean section secondary to gestational hypertension with 4 cm aortic aneurysm.  The risks of cesarean section discussed with the patient included but were not limited to: bleeding which may require transfusion or reoperation; infection which may require antibiotics; injury to bowel, bladder, ureters or other surrounding organs; injury to the fetus; need for additional procedures including hysterectomy in the event of a life-threatening hemorrhage; placental abnormalities wth subsequent pregnancies, incisional problems, thromboembolic phenomenon and other postoperative/anesthesia complications. The patient concurred with the proposed plan, giving informed written consent for the procedure.    FINDINGS:  Viable female infant in cephalic presentation, APGARs 8,9: weight pending  Clear amniotic fluid.  Intact placenta, three vessel cord.  Grossly normal uterus, ovaries and fallopian tubes. .   ANESTHESIA:  Spinal ESTIMATED BLOOD LOSS: 735 ml SPECIMENS: Placenta sent to L&D COMPLICATIONS: None immediate  PROCEDURE IN DETAIL:  The patient received intravenous antibiotics and had sequential compression devices applied to her lower extremities while in the preoperative area.  She was then taken to the operating room where spinal anesthesia was administered and was found to be adequate. She was then placed in a dorsal supine position with a leftward tilt, and prepped and draped in a sterile manner.  A foley catheter was placed into her bladder and attached to constant gravity.  After an adequate timeout was performed, a Pfannenstiel skin incision was made  with scalpel and carried through to the underlying layer of fascia. The fascia was incised in the midline and this incision was extended bilaterally using the Mayo scissors. Kocher clamps were applied to the superior aspect of the fascial incision and the underlying rectus muscles were dissected off bluntly. A similar process was carried out on the inferior aspect of the facial incision. The rectus muscles were separated in the midline bluntly and the peritoneum was entered bluntly. Bladder flap was created sharply and developed bluntly.  Bladder blade wasplaced.  A transverse hysterotomy was made with a scalpel and extended bilaterally bluntly. The bladder blade was then removed. The infant was successfully delivered using a single Kiwi vacuum pull, and cord was clamped and cut and infant was handed over to awaiting neonatology team. Uterine massage was then administered and the placenta delivered intact with three-vessel cord. The uterus was cleared of clot and debris.  The hysterotomy was closed with 0 chromic.  A second imbricating suture of 0-chromic was used to reinforce the incision and aid in hemostasis.  The peritoneum and rectus muscles were noted to be hemostatic and were reapproximated using 2-0 monocryl in a running fashion.  The fascia was closed with 0-Vicryl in a running fashion with good restoration of anatomy.  The subcutaneus tissue was copiously irrigated.  The skin was closed with 4-0 vicryl in a subcuticular fashion.  Pt tolerated the procedure will.  All counts were correct x2.  Pt went to the recovery room in stable condition.

## 2024-02-27 NOTE — Progress Notes (Signed)
 No change in H&P.  Mitchel Honour, DO

## 2024-02-27 NOTE — Anesthesia Postprocedure Evaluation (Signed)
 Anesthesia Post Note  Patient: Shirley Wang  Procedure(s) Performed: CESAREAN DELIVERY     Patient location during evaluation: PACU Anesthesia Type: Spinal Level of consciousness: oriented and awake and alert Pain management: pain level controlled Vital Signs Assessment: post-procedure vital signs reviewed and stable Respiratory status: spontaneous breathing, respiratory function stable and nonlabored ventilation Cardiovascular status: blood pressure returned to baseline and stable Postop Assessment: no headache, no backache, no apparent nausea or vomiting, patient able to bend at knees and spinal receding Anesthetic complications: no   No notable events documented.  Last Vitals:  Vitals:   02/27/24 1145 02/27/24 1159  BP: 116/76 119/75  Pulse: 81 82  Resp: 17 16  Temp: 36.7 C 36.5 C  SpO2: 100% 100%    Last Pain:  Vitals:   02/27/24 1145  TempSrc:   PainSc: 3    Pain Goal:    LLE Motor Response: Purposeful movement (02/27/24 1145) LLE Sensation: Tingling (02/27/24 1145) RLE Motor Response: Purposeful movement (02/27/24 1145) RLE Sensation: Tingling (02/27/24 1145)     Epidural/Spinal Function Cutaneous sensation: Tingles (02/27/24 1145), Patient able to flex knees: Yes (02/27/24 1145), Patient able to lift hips off bed: Yes (02/27/24 1145), Back pain beyond tenderness at insertion site: No (02/27/24 1145), Progressively worsening motor and/or sensory loss: No (02/27/24 1145), Bowel and/or bladder incontinence post epidural: No (02/27/24 1145)  Zylee Marchiano A.

## 2024-02-27 NOTE — Transfer of Care (Signed)
 Immediate Anesthesia Transfer of Care Note  Patient: Shirley Wang  Procedure(s) Performed: CESAREAN DELIVERY  Patient Location: PACU  Anesthesia Type:Spinal  Level of Consciousness: awake  Airway & Oxygen Therapy: Patient Spontanous Breathing  Post-op Assessment: Report given to RN and Post -op Vital signs reviewed and stable  Post vital signs: Reviewed and stable  Last Vitals:  Vitals Value Taken Time  BP 132/75 02/27/24 11:00  Temp    Pulse 79 02/27/24 11:05  Resp 22 02/27/24 11:05  SpO2 100 % 02/27/24 11:05  Vitals shown include unfiled device data.  Last Pain:  Vitals:   02/27/24 0745  TempSrc: Oral         Complications: No notable events documented.

## 2024-02-28 LAB — COMPREHENSIVE METABOLIC PANEL WITH GFR
ALT: 12 U/L (ref 0–44)
AST: 19 U/L (ref 15–41)
Albumin: 2.2 g/dL — ABNORMAL LOW (ref 3.5–5.0)
Alkaline Phosphatase: 90 U/L (ref 38–126)
Anion gap: 12 (ref 5–15)
BUN: 5 mg/dL — ABNORMAL LOW (ref 6–20)
CO2: 22 mmol/L (ref 22–32)
Calcium: 8.3 mg/dL — ABNORMAL LOW (ref 8.9–10.3)
Chloride: 104 mmol/L (ref 98–111)
Creatinine, Ser: 0.55 mg/dL (ref 0.44–1.00)
GFR, Estimated: >60 mL/min (ref >60)
Glucose, Bld: 97 mg/dL (ref 70–99)
Potassium: 3.9 mmol/L (ref 3.5–5.1)
Sodium: 138 mmol/L (ref 135–145)
Total Bilirubin: 0.3 mg/dL (ref 0.0–1.2)
Total Protein: 4.8 g/dL — ABNORMAL LOW (ref 6.5–8.1)

## 2024-02-28 LAB — CBC
HCT: 29.3 % — ABNORMAL LOW (ref 36.0–46.0)
Hemoglobin: 9.7 g/dL — ABNORMAL LOW (ref 12.0–15.0)
MCH: 30 pg (ref 26.0–34.0)
MCHC: 33.1 g/dL (ref 30.0–36.0)
MCV: 90.7 fL (ref 80.0–100.0)
Platelets: 192 K/uL (ref 150–400)
RBC: 3.23 MIL/uL — ABNORMAL LOW (ref 3.87–5.11)
RDW: 13.6 % (ref 11.5–15.5)
WBC: 16.6 K/uL — ABNORMAL HIGH (ref 4.0–10.5)
nRBC: 0 % (ref 0.0–0.2)

## 2024-02-28 LAB — RPR: RPR Ser Ql: NONREACTIVE

## 2024-02-28 MED ORDER — IBUPROFEN 600 MG PO TABS
600.0000 mg | ORAL_TABLET | Freq: Four times a day (QID) | ORAL | Status: DC
  Administered 2024-02-28 – 2024-02-29 (×5): 600 mg via ORAL
  Filled 2024-02-28 (×5): qty 1

## 2024-02-28 NOTE — Lactation Note (Signed)
 This note was copied from a baby's chart. Lactation Consultation Note  Patient Name: Shirley Wang Unijb'd Date: 02/28/2024 Age:29 hours Reason for consult: Initial assessment;Early term 3-38.6wks Mom holding baby STS. Mom stated baby had BF well and now just holding him STS.  Mom isn't having any trouble latching at this time. Newborn feeding habits, STS, I&O, body alignment. Mom encouraged to feed baby 8-12 times/24 hours and with feeding cues.  Encouraged mom to call for assistance as needed. LC wants to see baby feed, encouraged mom to call for LC at next feeding. Mom has her Employee DEBP.  Maternal Data Does the patient have breastfeeding experience prior to this delivery?: No  Feeding    LATCH Score                    Lactation Tools Discussed/Used    Interventions Interventions: Breast feeding basics reviewed;Skin to skin;Breast massage;Support pillows;Education;LC Services brochure  Discharge Pump: DEBP  Consult Status Consult Status: Follow-up Date: 02/28/24 Follow-up type: In-patient    Nickey Kloepfer G 02/28/2024, 1:55 AM

## 2024-02-28 NOTE — Telephone Encounter (Signed)
 Dr. Sheena spoke to provider 9/9.

## 2024-02-28 NOTE — Progress Notes (Signed)
 Postop Progress Note  S: No complaints. Feeling well. Lochia appropriate. No subjective fevers/chills, Cp, or SOB. Ambulating.   O:     02/28/2024    7:58 AM 02/28/2024    2:55 AM 02/27/2024   11:00 PM  Vitals with BMI  Systolic 125 115 867  Diastolic 64 65 66  Pulse 61 54 53    Gen: NAD, A&O Pulm: NWOB Abd: soft, appropriately ttp, fundus firm and below Umb. Incision c/d, honeycomb with some strikethrough Ext: No evidence of DVT, trace edema b/l  UOP adequate.   Labs Recent Results (from the past 2160 hours)  ECHOCARDIOGRAM COMPLETE     Status: None   Collection Time: 01/31/24  3:04 PM  Result Value Ref Range   S' Lateral 3.10 cm   Area-P 1/2 4.79 cm2   P 1/2 time 452 msec   AV Area mean vel 1.88 cm2   AV Area VTI 1.89 cm2   AR max vel 1.90 cm2   MV M vel 5.14 m/s   MV Peak grad 105.7 mmHg   AV Mean grad 13.0 mmHg   Ao pk vel 2.41 m/s   AV Peak grad 23.2 mmHg   AV Vena cont 0.20 cm   Est EF 55 - 60%   OB RESULT CONSOLE Group B Strep     Status: None   Collection Time: 02/18/24 12:00 AM  Result Value Ref Range   GBS Negative   ECHOCARDIOGRAM LIMITED     Status: None   Collection Time: 02/21/24 12:07 PM  Result Value Ref Range   Area-P 1/2 3.98 cm2   S' Lateral 3.20 cm   AV Area mean vel 1.98 cm2   AR max vel 2.00 cm2   AV Area VTI 1.92 cm2   P 1/2 time 386 msec   Ao pk vel 1.90 m/s   AV Mean grad 8.0 mmHg   AV Peak grad 14.5 mmHg   Est EF 55   Type and screen [facility]     Status: None   Collection Time: 02/27/24  7:34 AM  Result Value Ref Range   ABO/RH(D) A POS    Antibody Screen NEG    Sample Expiration      03/01/2024,2359 Performed at [facility], [address] [place], [place] [address]   CBC     Status: Abnormal   Collection Time: 02/27/24  7:36 AM  Result Value Ref Range   WBC 11.4 (H) 4.0 - 10.5 K/uL   RBC 4.07 3.87 - 5.11 MIL/uL   Hemoglobin 12.0 12.0 - 15.0 g/dL   HCT 63.7 63.9 - 53.9 %   MCV 88.9 80.0 -  100.0 fL   MCH 29.5 26.0 - 34.0 pg   MCHC 33.1 30.0 - 36.0 g/dL   RDW 86.4 88.4 - 84.4 %   Platelets 216 150 - 400 K/uL   nRBC 0.0 0.0 - 0.2 %    Comment: Performed at [facility], [address], [place], [place] [address]  Comprehensive metabolic panel     Status: Abnormal   Collection Time: 02/27/24  7:36 AM  Result Value Ref Range   Sodium 137 135 - 145 mmol/L   Potassium 3.4 (L) 3.5 - 5.1 mmol/L   Chloride 104 98 - 111 mmol/L   CO2 19 (L) 22 - 32 mmol/L   Glucose, Bld 96 70 - 99 mg/dL    Comment: Glucose reference range applies only to samples taken after fasting  for at least 8 hours.   BUN 7 6 - 20 mg/dL   Creatinine, Ser 9.45 0.44 - 1.00 mg/dL   Calcium 8.9 8.9 - 89.6 mg/dL   Total Protein 6.1 (L) 6.5 - 8.1 g/dL   Albumin 2.8 (L) 3.5 - 5.0 g/dL   AST 22 15 - 41 U/L   ALT 14 0 - 44 U/L   Alkaline Phosphatase 128 (H) 38 - 126 U/L   Total Bilirubin 0.4 0.0 - 1.2 mg/dL   GFR, Estimated >39 >39 mL/min    Comment: (NOTE) Calculated using the CKD-EPI Creatinine Equation (2021)    Anion gap 14 5 - 15    Comment: Performed at [facility], [address], [place], [place] [address]  RPR     Status: None   Collection Time: 02/27/24  7:36 AM  Result Value Ref Range   RPR Ser Ql NON REACTIVE NON REACTIVE    Comment: Performed at [facility], [address] [place], [place] [address]  CBC     Status: Abnormal   Collection Time: 02/28/24  5:04 AM  Result Value Ref Range   WBC 16.6 (H) 4.0 - 10.5 K/uL   RBC 3.23 (L) 3.87 - 5.11 MIL/uL   Hemoglobin 9.7 (L) 12.0 - 15.0 g/dL   HCT 70.6 (L) 63.9 - 53.9 %   MCV 90.7 80.0 - 100.0 fL   MCH 30.0 26.0 - 34.0 pg   MCHC 33.1 30.0 - 36.0 g/dL   RDW 86.3 88.4 - 84.4 %   Platelets 192 150 - 400 K/uL   nRBC 0.0 0.0 - 0.2 %    Comment: Performed at [facility], [address] [place], [place] [address]  Comprehensive metabolic panel     Status: Abnormal   Collection Time: 02/28/24  5:04 AM  Result Value Ref  Range   Sodium 138 135 - 145 mmol/L   Potassium 3.9 3.5 - 5.1 mmol/L   Chloride 104 98 - 111 mmol/L   CO2 22 22 - 32 mmol/L   Glucose, Bld 97 70 - 99 mg/dL    Comment: Glucose reference range applies only to samples taken after fasting for at least 8 hours.   BUN 5 (L) 6 - 20 mg/dL   Creatinine, Ser 9.44 0.44 - 1.00 mg/dL   Calcium 8.3 (L) 8.9 - 10.3 mg/dL   Total Protein 4.8 (L) 6.5 - 8.1 g/dL   Albumin 2.2 (L) 3.5 - 5.0 g/dL   AST 19 15 - 41 U/L   ALT 12 0 - 44 U/L   Alkaline Phosphatase 90 38 - 126 U/L   Total Bilirubin 0.3 0.0 - 1.2 mg/dL   GFR, Estimated >39 >39 mL/min    Comment: (NOTE) Calculated using the CKD-EPI Creatinine Equation (2021)    Anion gap 12 5 - 15    Comment: Performed at [facility], [address] [place], [place] [address]     A/P:  POD1 s/p pCS, doing well pp. AFVSS. Benign exam. 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  Also received balloon pulmonary valvuloplasty at approximately 74 months old).  ECHO showed slightly worsened dilatation of her proximal ascending aorta from 36 mm (2024) to 40 mm (stable over one mo) > follows with Dr. Sheena, doing well postpartum. Asx. Desires circ - wants to wait as he has not been eating as well. Plan circ  tomorrow. R/b/a reviewed. Continue current care.  Slater Door, MD

## 2024-02-29 ENCOUNTER — Other Ambulatory Visit (HOSPITAL_COMMUNITY): Payer: Self-pay

## 2024-02-29 MED ORDER — SENNOSIDES-DOCUSATE SODIUM 8.6-50 MG PO TABS: 2.0000 | ORAL_TABLET | Freq: Every day | ORAL | 1 refills | Status: AC

## 2024-02-29 MED ORDER — ACETAMINOPHEN 500 MG PO TABS: 1000.0000 mg | ORAL_TABLET | Freq: Four times a day (QID) | ORAL | 0 refills | Status: AC

## 2024-02-29 MED ORDER — SENNOSIDES-DOCUSATE SODIUM 8.6-50 MG PO TABS
2.0000 | ORAL_TABLET | Freq: Every day | ORAL | 1 refills | Status: DC
  Filled 2024-02-29: qty 30, 15d supply, fill #0

## 2024-02-29 MED ORDER — ACETAMINOPHEN 500 MG PO TABS
1000.0000 mg | ORAL_TABLET | Freq: Four times a day (QID) | ORAL | 0 refills | Status: DC
  Filled 2024-02-29: qty 30, 4d supply, fill #0

## 2024-02-29 MED ORDER — IBUPROFEN 600 MG PO TABS
600.0000 mg | ORAL_TABLET | Freq: Four times a day (QID) | ORAL | 0 refills | Status: DC
  Filled 2024-02-29: qty 30, 8d supply, fill #0

## 2024-02-29 MED ORDER — OXYCODONE HCL 5 MG PO TABS
5.0000 mg | ORAL_TABLET | ORAL | 0 refills | Status: DC | PRN
  Filled 2024-02-29: qty 20, 4d supply, fill #0

## 2024-02-29 MED ORDER — IBUPROFEN 600 MG PO TABS: 600.0000 mg | ORAL_TABLET | Freq: Four times a day (QID) | ORAL | 0 refills | Status: AC

## 2024-02-29 MED ORDER — OXYCODONE HCL 5 MG PO TABS: 5.0000 mg | ORAL_TABLET | ORAL | 0 refills | Status: DC | PRN

## 2024-02-29 NOTE — Lactation Note (Addendum)
 This note was copied from a baby's chart. Lactation Consultation Note  Patient Name: Shirley Wang Date: 02/29/2024 Age:29 hours Reason for consult: Follow-up assessment;1st time breastfeeding;Early term 37-38.6wks;Breastfeeding assistance;RN request  P1, Baby [redacted]w[redacted]d. Baby was circumcised today and has been sleepy. Mother hand expressed drops before latching. Assisted with latching in cross cradle hold.   Baby opened with wide gape.  Reviewed importance of good depth.  Intermittent swallows noted. Parents state they have view transitional greenish stools. Feed on demand with cues.  Goal 8-12+ times per day after first 24 hrs.  Place baby STS if not cueing.  Discussed early term infants and the possible need for pumping if baby is sleepy at the breast. Give volume back to baby. Reviewed volume expectations and cluster feeding. Provided mother with coconut oil for tender nipples  Fitted mother with 18 mm flange which mother states is comfortable at this time. Mother pumped 15 ml with manual pump.  Discussed pumping at work strategies.  Reviewed engorgement care and monitoring voids/stools.  Maternal Data Has patient been taught Hand Expression?: Yes Does the patient have breastfeeding experience prior to this delivery?: No  Feeding Mother's Current Feeding Choice: Breast Milk  LATCH Score Latch: Grasps breast easily, tongue down, lips flanged, rhythmical sucking.  Audible Swallowing: A few with stimulation  Type of Nipple: Everted at rest and after stimulation  Comfort (Breast/Nipple): Filling, red/small blisters or bruises, mild/mod discomfort  Hold (Positioning): Assistance needed to correctly position infant at breast and maintain latch.  LATCH Score: 7   Lactation Tools Discussed/Used Tools: Coconut oil  Interventions Interventions: Breast feeding basics reviewed;Assisted with latch;Hand express;Support pillows;Education  Discharge Discharge Education:  Engorgement and breast care;Warning signs for feeding baby;Outpatient recommendation Pump: Personal;Hands Free  Discussed possibly needed a DEBP.  Consult Status Consult Status: Complete Date: 02/29/24    Shannon Dines [facility] 02/29/2024, 11:51 AM

## 2024-02-29 NOTE — Discharge Summary (Signed)
 Postpartum Discharge Summary  Date of Service updated 02/29/2024     Patient Name: Shirley Wang DOB: [birth year] MRN: [id number]  Date of admission: 02/27/2024 Delivery date:02/27/2024 Delivering provider: DANNIELLE BOUCHARD Date of discharge: 02/29/2024  Admitting diagnosis: Gestational hypertension, third trimester [O13.3] Congenital heart disease [Q24.9] Gestational hypertension [O13.9] S/P cesarean section [Z98.891] Intrauterine pregnancy: [redacted]w[redacted]d     Secondary diagnosis:  Principal Problem:   Gestational hypertension Active Problems:   S/P cesarean section  Additional problems: 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  Also received balloon pulmonary valvuloplasty at approximately 75 months old).  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 yo G1P1001 at [redacted]w[redacted]d was admitted to the hospital 02/27/2024 for scheduled cesarean section with the following indication:maternal aortic dilation, gestational hypertension.Delivery details are as follows:  Membrane Rupture Time/Date:  ,   Delivery Method:C-Section, Vacuum Assisted Details of operation can be found in separate operative note.  Patient had a postpartum course complicated by none.  Her blood pressures were normotensive after deliveyr. She is ambulating, tolerating a regular diet, passing flatus, and urinating well. Patient is discharged home in stable condition on  02/29/24        Newborn Data: Birth date:02/27/2024 Birth time:10:05 AM Gender:Female Living status:Living Apgars:8 ,9  Weight:3380 g    Magnesium Sulfate received: No BMZ received:  No Rhophylac:N/A MMR:N/A T-DaP:Given prenatally Transfusion:No Immunizations administered: Immunization History  Administered Date(s) Administered   DTaP 04/10/1995, 07/26/1995, 09/20/1995, 05/22/1996, 08/16/2000   HIB (PRP-OMP) 04/10/1995, 07/26/1995, 09/20/1995, 05/19/1996   HPV Quadrivalent 07/09/2011, 09/20/2011, 01/02/2012   Hepatitis A 04/29/2007, 04/12/2008   Hepatitis B December 11, 1994, 04/10/1995, 01/17/1996   IPV 04/10/1995, 07/26/1995, 03/06/1996, 08/16/2000   Influenza Inj Mdck Quad Pf 04/02/2019   Influenza Nasal 03/23/2010, 05/10/2010, 03/16/2017   Influenza Split 04/15/2009, 04/30/2011, 04/22/2012   Influenza,inj,Quad PF,6+ Mos 03/16/2017   MMR 03/06/1996, 08/16/2000   Meningococcal Conjugate 04/15/2009, 12/21/2013   Moderna Sars-Covid-2 Vaccination 09/17/2019, 10/15/2019, 09/14/2020   PPD Test 10/14/2015, 10/21/2015   Tdap 01/20/2007, 10/14/2015   Varicella 05/22/1996    Physical exam  Vitals:   02/28/24 2120 02/28/24 2359 02/29/24 0344 02/29/24 0740  BP: 133/81 126/75 127/83 126/66  Pulse: 65 65 (!) 59 60  Resp:   16 16  Temp:  98.1 F (36.7 C) 98.2 F (36.8 C) 98.3 F (36.8 C)  TempSrc:  Oral Oral Oral  SpO2:  97% 100% 99%  Weight:      Height:       General: alert, cooperative, and no distress Lochia: appropriate Uterine Fundus: firm Incision: Healing well with no significant drainage, Dressing is clean, dry, and intact DVT Evaluation: No evidence of DVT seen on physical exam. Labs: Lab Results  Component Value Date   WBC 16.6 (H) 02/28/2024   HGB 9.7 (L) 02/28/2024   HCT 29.3 (L) 02/28/2024   MCV 90.7 02/28/2024   PLT 192 02/28/2024      Latest Ref Rng & Units 02/28/2024    5:04 AM  CMP  Glucose 70 - 99 mg/dL 97   BUN 6 - 20 mg/dL 5   Creatinine 9.55 - 8.99 mg/dL 9.44   Sodium 864 - 854 mmol/L 138   Potassium 3.5 - 5.1 mmol/L 3.9   Chloride 98 - 111 mmol/L 104   CO2 22 - 32 mmol/L 22   Calcium 8.9 - 10.3 mg/dL 8.3   Total Protein 6.5 -  8.1 g/dL 4.8   Total Bilirubin 0.0 - 1.2 mg/dL 0.3   Alkaline Phos 38 - 126 U/L 90   AST 15 - 41 U/L 19   ALT 0 - 44 U/L 12    Edinburgh Score:    02/27/2024   11:30 PM  Edinburgh Postnatal Depression Scale Screening Tool  I have been able to laugh and see the funny side of things. 0  I have looked forward with enjoyment to things. 0  I have blamed myself unnecessarily when things went wrong. 1  I have been anxious or worried for no good reason. 2  I have felt scared or panicky for no good reason. 0  Things have been getting on top of me. 1  I have been so unhappy that I have had difficulty sleeping. 0  I have felt sad or miserable. 0  I have been so unhappy that I have been crying. 0  The thought of harming myself has occurred to me. 0  Edinburgh Postnatal Depression Scale Total 4      After visit meds:  Allergies as of 02/29/2024       Reactions   Dairy Aid [tilactase]    Nickel Rash   Oxycodone -acetaminophen  Nausea And Vomiting        Medication List     STOP taking these medications    aspirin 81 MG chewable tablet   NIFEdipine  30 MG 24 hr tablet Commonly known as: PROCARDIA -XL/NIFEDICAL-XL       TAKE these medications    acetaminophen  500 MG tablet Commonly known as: TYLENOL  Take 2 tablets (1,000 mg total) by mouth every 6 (six) hours.   cetirizine  10 MG tablet Commonly known as: ZYRTEC  Take 10 mg by mouth in the morning.   famotidine 20 MG tablet Commonly known as: PEPCID Take 20 mg by mouth in the morning.   ferrous sulfate  324 MG Tbec Take 324 mg by mouth daily in the afternoon.   hydrOXYzine 50 MG tablet Commonly known as: ATARAX Take 50 mg by mouth at bedtime as needed (sleep).   ibuprofen  600 MG tablet Commonly known as: ADVIL  Take 1 tablet (600 mg total) by mouth 4 (four) times daily.   oxyCODONE  5 MG immediate release tablet Commonly known as: Oxy IR/ROXICODONE  Take 1 tablet (5 mg total) by mouth every 4 (four) hours as needed  for moderate pain (pain score 4-6).   prenatal vitamin w/FE, FA 27-1 MG Tabs tablet Take 1 tablet by mouth in the morning.   senna-docusate 8.6-50 MG tablet Commonly known as: Senokot-S Take 2 tablets by mouth daily.         Discharge home in stable condition Infant Feeding: Bottle and Breast Infant Disposition:home with mother Discharge instruction: per After Visit Summary and Postpartum booklet. Activity: Advance as tolerated. Pelvic rest for 6 weeks.  Diet: routine diet Anticipated Birth Control: Unsure Postpartum Appointment:6 weeks Additional Postpartum F/U: BP check 1 week Future Appointments: Future Appointments  Date Time Provider Department Center  03/11/2024 11:40 AM Tobb, Kardie, DO [facility]      02/29/2024 [place]  JINNY Door, MD

## 2024-03-01 LAB — BIRTH TISSUE RECOVERY COLLECTION (PLACENTA DONATION)

## 2024-03-02 ENCOUNTER — Other Ambulatory Visit (HOSPITAL_COMMUNITY): Payer: Self-pay

## 2024-03-09 ENCOUNTER — Telehealth (HOSPITAL_COMMUNITY): Payer: Self-pay | Admitting: *Deleted

## 2024-03-09 NOTE — Telephone Encounter (Signed)
 Attempted hospital discharge follow-up call. Left message for patient to return RN call with any questions or concerns. Allean IVAR Carton, RN, 03/09/24, [phone]

## 2024-03-11 ENCOUNTER — Ambulatory Visit: Attending: Cardiology | Admitting: Cardiology

## 2024-03-11 VITALS — BP 126/82 | HR 74 | Ht 64.0 in | Wt 206.6 lb

## 2024-03-11 DIAGNOSIS — I77819 Aortic ectasia, unspecified site: Secondary | ICD-10-CM

## 2024-03-11 DIAGNOSIS — Z8774 Personal history of (corrected) congenital malformations of heart and circulatory system: Secondary | ICD-10-CM | POA: Diagnosis not present

## 2024-03-11 DIAGNOSIS — Z8759 Personal history of other complications of pregnancy, childbirth and the puerperium: Secondary | ICD-10-CM | POA: Diagnosis not present

## 2024-03-11 DIAGNOSIS — Z8632 Personal history of gestational diabetes: Secondary | ICD-10-CM | POA: Diagnosis not present

## 2024-03-11 DIAGNOSIS — Z8679 Personal history of other diseases of the circulatory system: Secondary | ICD-10-CM

## 2024-03-11 NOTE — Patient Instructions (Signed)
 Medication Instructions:  Your physician recommends that you continue on your current medications as directed. Please refer to the Current Medication list given to you today.  *If you need a refill on your cardiac medications before your next appointment, please call your pharmacy*   Follow-Up: At [facility], you and your health needs are our priority.  As part of our continuing mission to provide you with exceptional heart care, our providers are all part of one team.  This team includes your primary Cardiologist (physician) and Advanced Practice Providers or APPs (Physician Assistants and Nurse Practitioners) who all work together to provide you with the care you need, when you need it.  Your next appointment:    As needed  Provider:   Kardie Tobb, DO

## 2024-03-12 ENCOUNTER — Ambulatory Visit: Payer: Self-pay | Admitting: Cardiology

## 2024-03-14 NOTE — Progress Notes (Signed)
 Cardio-Obstetrics Clinic  Follow up  Date:  03/14/2024   ID:  Shirley Wang, DOB 08-10-1995, MRN [id number]  PCP:  Corwin Antu, FNP   [facility] Providers Cardiologist:  Shelda Bruckner, MD  Electrophysiologist:  None  Cardiology APP:  Vannie Reche RAMAN, NP      Referring MD: Corwin Antu, FNP   Chief Complaint:  I am ok:  History of Present Illness:    Shirley Wang is a 28 y.o. female [G1P1001] who is being seen in follow up postpartum She delivered via c-section due aortic dilatation.    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  Also received balloon pulmonary valvuloplasty at approximately 31 months old).   Her last visit with me was prior to delivery.She was hypertensive we started Nifedipine .  Postpartum she is doing well. She is here with the baby and her husband.   No specific complaints at this time.   Prior CV Studies Reviewed: The following studies were reviewed today: Echo reviewed   Past Medical History:  Diagnosis Date   Allergic rhinitis    has used zyrtec  and nasonex in the past prn   COVID-19 11/21/2020   Heart disease    PDA (patent ductus arteriosus)    Pulmonic stenosis, congenital    Cong Valvar PS, ballooned at 11 months    Urinary tract infection 1999    Past Surgical History:  Procedure Laterality Date   CARDIAC SURGERY  11/2015   sinus venosus ASD and PAPVR repair, PDA ligation [facility] at [facility])   CESAREAN SECTION N/A 02/27/2024   Procedure: CESAREAN DELIVERY;  Surgeon: Dannielle Bouchard, DO;  Location: [facility] LD ORS;  Service: Obstetrics;  Laterality: N/A;   PULMONARY VALVULOPLASTY     [facility]   WISDOM TOOTH EXTRACTION        OB History     Gravida  1   Para  1   Term  1   Preterm      AB      Living  1      SAB      IAB      Ectopic      Multiple  0   Live Births  1                Current Medications: Current Meds  Medication Sig   acetaminophen  (TYLENOL ) 500 MG tablet Take 2 tablets (1,000 mg total) by mouth every 6 (six) hours. (Patient taking differently: Take 1,000 mg by mouth every 6 (six) hours as needed for mild pain (pain score 1-3).)   cetirizine  (ZYRTEC ) 10 MG tablet Take 10 mg by mouth in the morning.   famotidine (PEPCID) 20 MG tablet Take 20 mg by mouth in the morning.   ferrous sulfate  324 MG TBEC Take 324 mg by mouth daily in the afternoon.   hydrOXYzine (ATARAX) 50 MG tablet Take 50 mg by mouth at bedtime as needed (sleep).   ibuprofen  (ADVIL ) 600 MG tablet Take 1 tablet (600 mg total) by mouth 4 (four) times daily. (Patient taking differently: Take 600 mg by mouth every 4 (four) hours as needed for mild pain (pain score 1-3).)   prenatal vitamin w/FE, FA (PRENATAL 1 + 1) 27-1 MG TABS tablet Take 1 tablet by mouth in the morning.   senna-docusate (SENOKOT-S) 8.6-50 MG tablet Take 2 tablets by mouth daily. (Patient taking differently: Take 2 tablets by mouth  at bedtime as needed for mild constipation.)     Allergies:   Dairy aid [tilactase], Nickel, and Oxycodone -acetaminophen    Social History   Socioeconomic History   Marital status: Married    Spouse name: Marelyn Rouser   Number of children: 0   Years of education: Chief Operating Officer in Nursing   Highest education level: Not on file  Occupational History   Occupation: Teacher, adult education: [employer]    Comment: urology progressive care, teleheatlh  Tobacco Use   Smoking status: Never   Smokeless tobacco: Never  Vaping Use   Vaping status: Never Used  Substance and Sexual Activity   Alcohol use: Yes    Comment: social, never more than 3   Drug use: No   Sexual activity: Yes    Partners: Male    Birth control/protection: Condom  Other Topics Concern   Not on file  Social History Narrative   09/07/19   From: the area   Living: with husband Velma   Work: Engineer, 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       Family: parents nearby and good relationship      Enjoys: fishing, tennis      Exercise: not currently   Diet: low sodium due to heart disease      Safety   Seat belts: Yes    Guns: Yes  and secure   Safe in relationships: yes   Social Drivers of Corporate investment banker Strain: Not on file  Food Insecurity: No Food Insecurity (02/27/2024)   Hunger Vital Sign    Worried About Running Out of Food in the Last Year: Never true    Ran Out of Food in the Last Year: Never true  Transportation Needs: No Transportation Needs (02/27/2024)   PRAPARE - Administrator, Civil Service (Medical): No    Lack of Transportation (Non-Medical): No  Physical Activity: Not on file  Stress: Not on file  Social Connections: Unknown (07/31/2022)   Received from [facility]   Social Network    Social Network: Not on file      Family History  Problem Relation Age of Onset   Breast cancer Mother 53       genetic testing negative   Hypertension Father    Crohn's disease Father    Diabetes type II Father        pre   Asthma Brother    Breast cancer Maternal Grandmother 48   Heart disease Neg Hx    Stroke Neg Hx       ROS:   Please see the history of present illness.     All other systems reviewed and are negative.   Labs/EKG Reviewed:    EKG:  EKG not ordered today.    Recent Labs: 02/28/2024: ALT 12; BUN 5; Creatinine, Ser 0.55; Hemoglobin 9.7; Platelets 192; Potassium 3.9; Sodium 138   Recent Lipid Panel Lab Results  Component Value Date/Time   CHOL 161 02/04/2023 11:10 AM   TRIG 195.0 (H) 02/04/2023 11:10 AM   HDL 56.90 02/04/2023 11:10 AM   CHOLHDL 3 02/04/2023 11:10 AM   LDLCALC 65 02/04/2023 11:10 AM    Physical Exam:    VS:  BP 126/82   Pulse 74   Ht 5' 4 (1.626 m)   Wt 206 lb 9.6 oz (93.7 kg)   LMP 09/13/2022   SpO2 99%   BMI 35.46 kg/m     Wt Readings from Last 3 Encounters:  03/11/24 206 lb 9.6 oz (93.7 kg)  02/26/24 225 lb (102.1 kg)   02/20/24 226 lb 9.6 oz (102.8 kg)     GEN:  Well nourished, well developed in no acute distress HEENT: Normal NECK: No JVD; No carotid bruits LYMPHATICS: No lymphadenopathy CARDIAC: RRR, no murmurs, rubs, gallops RESPIRATORY:  Clear to auscultation without rales, wheezing or rhonchi  ABDOMEN: Soft, non-tender, non-distended MUSCULOSKELETAL:  No edema; No deformity  SKIN: Warm and dry NEUROLOGIC:  Alert and oriented x 3 PSYCHIATRIC:  Normal affect    Risk Assessment/Risk Calculators:                  ASSESSMENT & PLAN:    Aortic dilatation  Gestational diabetes  Gestational hypertension  She will need her repeat surveillance echo in the setting of her aortic dilatation.   Blood pressure have improved - she is not on antihypertensive medications at this time.   Clinically does not appear to be volume overloaded  Lipid profile showed evidence of hypertriglyceridemia this can be reassessed in the postpartum period - its best to wait after 6 months   At this time she does not need a follow up in the cardio-obstetrics clinic. We will be happy to see her once pregnant again. She follows with Dr. Lonni and her follow up visit will be with Dr. Lonni.     Patient Instructions  Medication Instructions:  Your physician recommends that you continue on your current medications as directed. Please refer to the Current Medication list given to you today.  *If you need a refill on your cardiac medications before your next appointment, please call your pharmacy*   Follow-Up: At [facility], you and your health needs are our priority.  As part of our continuing mission to provide you with exceptional heart care, our providers are all part of one team.  This team includes your primary Cardiologist (physician) and Advanced Practice Providers or APPs (Physician Assistants and Nurse Practitioners) who all work together to provide you with the care you need, when you  need it.  Your next appointment:    As needed  Provider:   Vendetta Pittinger, DO           Dispo:  No follow-ups on file.   Medication Adjustments/Labs and Tests Ordered: Current medicines are reviewed at length with the patient today.  Concerns regarding medicines are outlined above.  Tests Ordered: No orders of the defined types were placed in this encounter.  Medication Changes: No orders of the defined types were placed in this encounter.

## 2024-04-07 ENCOUNTER — Other Ambulatory Visit (HOSPITAL_COMMUNITY): Payer: Self-pay

## 2024-04-07 DIAGNOSIS — B372 Candidiasis of skin and nail: Secondary | ICD-10-CM | POA: Diagnosis not present

## 2024-04-07 DIAGNOSIS — Z1389 Encounter for screening for other disorder: Secondary | ICD-10-CM | POA: Diagnosis not present

## 2024-04-07 DIAGNOSIS — Z23 Encounter for immunization: Secondary | ICD-10-CM | POA: Diagnosis not present

## 2024-04-10 ENCOUNTER — Encounter (HOSPITAL_BASED_OUTPATIENT_CLINIC_OR_DEPARTMENT_OTHER): Payer: Self-pay

## 2024-04-10 DIAGNOSIS — I77819 Aortic ectasia, unspecified site: Secondary | ICD-10-CM

## 2024-04-10 NOTE — Telephone Encounter (Signed)
 Echo order and recall placed.  Jahleel Stroschein S Briar Sword, NP

## 2024-04-29 DIAGNOSIS — Z309 Encounter for contraceptive management, unspecified: Secondary | ICD-10-CM | POA: Diagnosis not present

## 2024-04-29 DIAGNOSIS — T8189XS Other complications of procedures, not elsewhere classified, sequela: Secondary | ICD-10-CM | POA: Diagnosis not present

## 2024-06-05 ENCOUNTER — Other Ambulatory Visit: Payer: Self-pay

## 2024-06-05 ENCOUNTER — Emergency Department (HOSPITAL_COMMUNITY)
Admission: EM | Admit: 2024-06-05 | Discharge: 2024-06-05 | Disposition: A | Discharge status: Home / Self Care | Attending: Emergency Medicine | Admitting: Emergency Medicine

## 2024-06-05 ENCOUNTER — Encounter (HOSPITAL_COMMUNITY): Payer: Self-pay

## 2024-06-05 ENCOUNTER — Emergency Department (HOSPITAL_COMMUNITY)

## 2024-06-05 DIAGNOSIS — N132 Hydronephrosis with renal and ureteral calculous obstruction: Secondary | ICD-10-CM | POA: Diagnosis not present

## 2024-06-05 DIAGNOSIS — N200 Calculus of kidney: Secondary | ICD-10-CM

## 2024-06-05 DIAGNOSIS — R109 Unspecified abdominal pain: Secondary | ICD-10-CM | POA: Diagnosis not present

## 2024-06-05 LAB — URINALYSIS, ROUTINE W REFLEX MICROSCOPIC
Bilirubin Urine: NEGATIVE
Glucose, UA: NEGATIVE mg/dL
Ketones, ur: 5 mg/dL — AB
Leukocytes,Ua: NEGATIVE
Nitrite: NEGATIVE
Protein, ur: 100 mg/dL — AB
RBC / HPF: >50 RBC/hpf (ref 0–5)
Specific Gravity, Urine: 1.026 (ref 1.005–1.030)
pH: 6 (ref 5.0–8.0)

## 2024-06-05 LAB — BASIC METABOLIC PANEL WITH GFR
Anion gap: 13 (ref 5–15)
BUN: 18 mg/dL (ref 6–20)
CO2: 24 mmol/L (ref 22–32)
Calcium: 9.6 mg/dL (ref 8.9–10.3)
Chloride: 102 mmol/L (ref 98–111)
Creatinine, Ser: 0.71 mg/dL (ref 0.44–1.00)
GFR, Estimated: >60 mL/min
Glucose, Bld: 108 mg/dL — ABNORMAL HIGH (ref 70–99)
Potassium: 4 mmol/L (ref 3.5–5.1)
Sodium: 139 mmol/L (ref 135–145)

## 2024-06-05 LAB — CBC
HCT: 41.5 % (ref 36.0–46.0)
Hemoglobin: 13.3 g/dL (ref 12.0–15.0)
MCH: 28.6 pg (ref 26.0–34.0)
MCHC: 32 g/dL (ref 30.0–36.0)
MCV: 89.2 fL (ref 80.0–100.0)
Platelets: 257 K/uL (ref 150–400)
RBC: 4.65 MIL/uL (ref 3.87–5.11)
RDW: 12.5 % (ref 11.5–15.5)
WBC: 13.9 K/uL — ABNORMAL HIGH (ref 4.0–10.5)
nRBC: 0 % (ref 0.0–0.2)

## 2024-06-05 LAB — HCG, SERUM, QUALITATIVE: Preg, Serum: NEGATIVE

## 2024-06-05 MED ORDER — ONDANSETRON HCL 4 MG/2ML IJ SOLN
4.0000 mg | Freq: Once | INTRAMUSCULAR | Status: AC
  Administered 2024-06-05: 4 mg via INTRAVENOUS
  Filled 2024-06-05: qty 2

## 2024-06-05 MED ORDER — HYDROCODONE-ACETAMINOPHEN 5-325 MG PO TABS
1.0000 | ORAL_TABLET | Freq: Once | ORAL | Status: AC
  Administered 2024-06-05: 1 via ORAL
  Filled 2024-06-05: qty 1

## 2024-06-05 MED ORDER — ONDANSETRON HCL 4 MG PO TABS: 4.0000 mg | ORAL_TABLET | Freq: Four times a day (QID) | ORAL | 0 refills | Status: AC

## 2024-06-05 MED ORDER — HYDROCODONE-ACETAMINOPHEN 5-325 MG PO TABS: 1.0000 | ORAL_TABLET | ORAL | 0 refills | Status: AC | PRN

## 2024-06-05 MED ORDER — TAMSULOSIN HCL 0.4 MG PO CAPS: 0.4000 mg | ORAL_CAPSULE | Freq: Every day | ORAL | 0 refills | Status: AC

## 2024-06-05 MED ORDER — MORPHINE SULFATE (PF) 4 MG/ML IV SOLN
4.0000 mg | Freq: Once | INTRAVENOUS | Status: AC
  Administered 2024-06-05: 4 mg via INTRAVENOUS
  Filled 2024-06-05: qty 1

## 2024-06-05 MED ORDER — KETOROLAC TROMETHAMINE 30 MG/ML IJ SOLN
15.0000 mg | Freq: Once | INTRAMUSCULAR | Status: AC
  Administered 2024-06-05: 15 mg via INTRAVENOUS
  Filled 2024-06-05: qty 1

## 2024-06-05 NOTE — ED Triage Notes (Signed)
 Patient has right sided flank pain that began 2 hours ago. Vomiting, nausea, chills. Had a baby 3 months ago.

## 2024-06-05 NOTE — ED Notes (Signed)
 Lab called for hCG results

## 2024-06-05 NOTE — Discharge Instructions (Signed)
 You were seen in the emergency department for your right-sided leg pain.  You do have a 6 mm kidney stone in your distal ureter.  You should drink plenty of fluids and I have given you Flomax to help pass the stone.  You can take Tylenol  and Motrin  every 6 hours as needed for pain and I given you Norco for breakthrough pain.  This can make you drowsy so do not take it while driving, working or operating heavy machinery.  I would not give your baby your breastmilk.  For 6 hours after taking the Norco.  You can take Zofran  as needed for nausea.  You should follow-up with urology to have your symptoms rechecked.  You should return to the emergency department if having uncontrollable pain, repetitive vomiting despite the nausea medicine, fevers or any other new or concerning symptoms.

## 2024-06-05 NOTE — ED Provider Notes (Signed)
 " [place] EMERGENCY DEPARTMENT AT [facility] Provider Note   CSN: [id number] Arrival date & time: 06/05/24  8166     Patient presents with: Flank Pain   Shirley Wang is a 28 y.o. female.   Patient is a 28 year old female with a past medical history of congenital heart disease status post repair and is 3 months postpartum presenting to the emergency department with right sided flank pain.  Patient states a few hours ago she had sudden onset of right sided flank pain.  She states the pain has been constant since it started and has become more severe.  She states that it has not moved.  She states she has had associated nausea and vomiting.  She denies any fevers, diarrhea or constipation, dysuria or hematuria or any abnormal vaginal bleeding or discharge.  The history is provided by the patient.  Flank Pain       Prior to Admission medications  Medication Sig Start Date End Date Taking? Authorizing Provider  HYDROcodone -acetaminophen  (NORCO/VICODIN) 5-325 MG tablet Take 1 tablet by mouth every 4 (four) hours as needed. 06/05/24  Yes Ellouise, Gerrod Maule K, DO  ondansetron  (ZOFRAN ) 4 MG tablet Take 1 tablet (4 mg total) by mouth every 6 (six) hours. 06/05/24  Yes Ellouise, Ermine Spofford K, DO  tamsulosin (FLOMAX) 0.4 MG CAPS capsule Take 1 capsule (0.4 mg total) by mouth daily. 06/05/24  Yes Kingsley, Meliana Canner K, DO  acetaminophen  (TYLENOL ) 500 MG tablet Take 2 tablets (1,000 mg total) by mouth every 6 (six) hours. Patient taking differently: Take 1,000 mg by mouth every 6 (six) hours as needed for mild pain (pain score 1-3). 02/29/24   Laurence Slater PARAS, MD  cetirizine  (ZYRTEC ) 10 MG tablet Take 10 mg by mouth in the morning.    [provider]  famotidine (PEPCID) 20 MG tablet Take 20 mg by mouth in the morning.    [provider]  ferrous sulfate  324 MG TBEC Take 324 mg by mouth daily in the afternoon.    [provider]  hydrOXYzine (ATARAX) 50 MG  tablet Take 50 mg by mouth at bedtime as needed (sleep).    [provider]  ibuprofen  (ADVIL ) 600 MG tablet Take 1 tablet (600 mg total) by mouth 4 (four) times daily. Patient taking differently: Take 600 mg by mouth every 4 (four) hours as needed for mild pain (pain score 1-3). 02/29/24   Laurence Slater PARAS, MD  prenatal vitamin w/FE, FA (PRENATAL 1 + 1) 27-1 MG TABS tablet Take 1 tablet by mouth in the morning.    [provider]  senna-docusate (SENOKOT-S) 8.6-50 MG tablet Take 2 tablets by mouth daily. Patient taking differently: Take 2 tablets by mouth at bedtime as needed for mild constipation. 02/29/24   Laurence Slater PARAS, MD    Allergies: Dairy aid [tilactase], Nickel, and Oxycodone -acetaminophen     Review of Systems  Genitourinary:  Positive for flank pain.    Updated Vital Signs BP (!) 148/84   Pulse 68   Temp 98.3 F (36.8 C) (Oral)   Resp 17   Ht 5' 5 (1.651 m)   Wt 87.5 kg   SpO2 100%   BMI 32.12 kg/m   Physical Exam Vitals and nursing note reviewed.  Constitutional:      General: She is not in acute distress.    Appearance: Normal appearance.     Comments: Uncomfortable appearing, writhing around on the bed  HENT:     Head: Normocephalic and  atraumatic.     Nose: Nose normal.     Mouth/Throat:     Mouth: Mucous membranes are moist.     Pharynx: Oropharynx is clear.  Eyes:     Extraocular Movements: Extraocular movements intact.     Conjunctiva/sclera: Conjunctivae normal.  Cardiovascular:     Rate and Rhythm: Normal rate and regular rhythm.     Heart sounds: Normal heart sounds.  Pulmonary:     Effort: Pulmonary effort is normal.     Breath sounds: Normal breath sounds.  Abdominal:     General: Abdomen is flat.     Palpations: Abdomen is soft.     Tenderness: There is no abdominal tenderness. There is no right CVA tenderness or left CVA tenderness.  Musculoskeletal:        General: Normal range of motion.     Cervical back: Normal range of  motion.  Skin:    General: Skin is warm and dry.  Neurological:     General: No focal deficit present.     Mental Status: She is alert and oriented to person, place, and time.  Psychiatric:        Mood and Affect: Mood normal.        Behavior: Behavior normal.     (all labs ordered are listed, but only abnormal results are displayed) Labs Reviewed  URINALYSIS, ROUTINE W REFLEX MICROSCOPIC - Abnormal; Notable for the following components:      Result Value   APPearance HAZY (*)    Hgb urine dipstick MODERATE (*)    Ketones, ur 5 (*)    Protein, ur 100 (*)    Bacteria, UA RARE (*)    All other components within normal limits  BASIC METABOLIC PANEL WITH GFR - Abnormal; Notable for the following components:   Glucose, Bld 108 (*)    All other components within normal limits  CBC - Abnormal; Notable for the following components:   WBC 13.9 (*)    All other components within normal limits  HCG, SERUM, QUALITATIVE    EKG: None  Radiology: CT Renal Stone Study Result Date: 06/05/2024 EXAM: CT UROGRAM 06/05/2024 09:16:39 PM TECHNIQUE: CT of the abdomen and pelvis was performed without the administration of intravenous contrast as per CT urogram protocol. Multiplanar reformatted images as well as MIP urogram images are provided for review. Automated exposure control, iterative reconstruction, and/or weight based adjustment of the mA/kV was utilized to reduce the radiation dose to as low as reasonably achievable. COMPARISON: 09/08/2012 CLINICAL HISTORY: Abdominal/flank pain, stone suspected FINDINGS: LOWER CHEST: No acute abnormality. LIVER: The liver is unremarkable. GALLBLADDER AND BILE DUCTS: Gallbladder is unremarkable. No biliary ductal dilatation. SPLEEN: No acute abnormality. PANCREAS: No acute abnormality. ADRENAL GLANDS: No acute abnormality. KIDNEYS, URETERS AND BLADDER: 6 mm stone in the distal right ureter with moderate right hydroureteronephrosis. Mild asymmetric right  perinephric stranding. No left hydronephrosis. No left perinephric or periureteral stranding. Urinary bladder is unremarkable. GI AND BOWEL: Stomach demonstrates no acute abnormality. There is no bowel obstruction. Normal appendix. PERITONEUM AND RETROPERITONEUM: No ascites. No free air. VASCULATURE: Aorta is normal in caliber. LYMPH NODES: No lymphadenopathy. REPRODUCTIVE ORGANS: No acute abnormality. BONES AND SOFT TISSUES: No acute osseous abnormality. No focal soft tissue abnormality. IMPRESSION: 1. 6 mm stone in the distal right ureter with moderate right hydroureteronephrosis. Electronically signed by: Norman Gatlin MD 06/05/2024 09:24 PM EST RP Workstation: [id number]     Procedures   Medications Ordered in the ED  ketorolac (TORADOL)  30 MG/ML injection 15 mg (15 mg Intravenous Given 06/05/24 1932)  morphine  (PF) 4 MG/ML injection 4 mg (4 mg Intravenous Given 06/05/24 1935)  ondansetron  (ZOFRAN ) injection 4 mg (4 mg Intravenous Given 06/05/24 1935)  HYDROcodone -acetaminophen  (NORCO/VICODIN) 5-325 MG per tablet 1 tablet (1 tablet Oral Given 06/05/24 2201)  ondansetron  (ZOFRAN ) injection 4 mg (4 mg Intravenous Given 06/05/24 2201)    Clinical Course as of 06/05/24 2222  Fri Jun 05, 2024  2220 6mm stone in R distal ureter with mild hydro. Normal renal function, no UTI on UA. Pain is now controlled. She is stable for discharge with outpatient urology follow up. [VK]    Clinical Course User Index [VK] Kingsley, Brownie Gockel K, DO                                 Medical Decision Making This patient presents to the ED with chief complaint(s) of right flank pain with pertinent past medical history of congenital heart defect status post repair, 3 months postpartum which further complicates the presenting complaint. The complaint involves an extensive differential diagnosis and also carries with it a high risk of complications and morbidity.    The differential diagnosis includes nephrolithiasis,  pyelonephritis, other intra-abdominal infection, gastroenteritis, pregnancy, ectopic, MSK pain  Additional history obtained: Additional history obtained from N/A Records reviewed outpatient cardiology records  ED Course and Reassessment: On patient's arrival she is hemodynamically stable, uncomfortable appearing but no acute distress.  No concern for possible kidney stone and she has had labs, urine as well as CT renal stone study ordered.  The patient was given pain and nausea control and will be closely reassessed.  Independent labs interpretation:  The following labs were independently interpreted: hematuria without sounds of infection, otherwise labs within normal range  Independent visualization of imaging: - I independently visualized the following imaging with scope of interpretation limited to determining acute life threatening conditions related to emergency care: CT renal, which revealed 6mm stone in R distal ureter  Consultation: - Consulted or discussed management/test interpretation w/ external professional: N/A  Consideration for admission or further workup: Patient has no emergent conditions requiring admission or further work-up at this time and is stable for discharge home with urology follow-up  Social Determinants of health: N/A    Amount and/or Complexity of Data Reviewed Labs: ordered. Radiology: ordered.  Risk Prescription drug management.       Final diagnoses:  Kidney stone    ED Discharge Orders          Ordered    HYDROcodone -acetaminophen  (NORCO/VICODIN) 5-325 MG tablet  Every 4 hours PRN        06/05/24 2219    ondansetron  (ZOFRAN ) 4 MG tablet  Every 6 hours        06/05/24 2219    tamsulosin (FLOMAX) 0.4 MG CAPS capsule  Daily        06/05/24 2219               Kingsley, Chitara Clonch K, DO 06/05/24 2222  "

## 2024-06-09 ENCOUNTER — Ambulatory Visit: Admitting: Urology

## 2024-06-09 VITALS — BP 126/79 | HR 71

## 2024-06-09 DIAGNOSIS — N201 Calculus of ureter: Secondary | ICD-10-CM

## 2024-06-09 LAB — URINALYSIS, ROUTINE W REFLEX MICROSCOPIC
Bilirubin, UA: NEGATIVE
Glucose, UA: NEGATIVE
Ketones, UA: NEGATIVE
Leukocytes,UA: NEGATIVE
Nitrite, UA: NEGATIVE
Protein,UA: NEGATIVE
RBC, UA: NEGATIVE
Specific Gravity, UA: 1.03 (ref 1.005–1.030)
Urobilinogen, Ur: 0.2 mg/dL (ref 0.2–1.0)
pH, UA: 6 (ref 5.0–7.5)

## 2024-06-09 MED ORDER — OXYCODONE HCL 5 MG PO TABS: 5.0000 mg | ORAL_TABLET | ORAL | 0 refills | Status: AC | PRN

## 2024-06-09 NOTE — Progress Notes (Signed)
 "  Chief Complaint: Kidney stone  History of Present Illness:  28 year old female comes in today for evaluation and management of a right distal ureteral stone.  She has no prior history of a kidney stone before Last week, when she presented to the emergency room with right flank pain.  CT scan revealed a 6 mm right distal ureteral stone with moderate hydronephrosis.  Since that time she has had persistent right flank/lower quadrant pain.  No fever or chills.  No gross hematuria.  She did have cesarean delivery on 11 September.  Past Medical History:  Past Medical History:  Diagnosis Date   Allergic rhinitis    has used zyrtec  and nasonex in the past prn   COVID-19 11/21/2020   Heart disease    PDA (patent ductus arteriosus)    Pulmonic stenosis, congenital    Cong Valvar PS, ballooned at 11 months    Urinary tract infection 1999    Past Surgical History:  Past Surgical History:  Procedure Laterality Date   CARDIAC SURGERY  11/2015   sinus venosus ASD and PAPVR repair, PDA ligation Jimmy CTS at [facility])   CESAREAN SECTION N/A 02/27/2024   Procedure: CESAREAN DELIVERY;  Surgeon: Dannielle Bouchard, DO;  Location: [facility] LD ORS;  Service: Obstetrics;  Laterality: N/A;   PULMONARY VALVULOPLASTY     [facility]   WISDOM TOOTH EXTRACTION      Allergies:  Allergies[1]  Family History:  Family History  Problem Relation Age of Onset   Breast cancer Mother 64       genetic testing negative   Hypertension Father    Crohn's disease Father    Diabetes type II Father        pre   Asthma Brother    Breast cancer Maternal Grandmother 48   Heart disease Neg Hx    Stroke Neg Hx     Social History:  Social History[2]  Review of symptoms:  Constitutional:  Negative for unexplained weight loss, night sweats, fever, chills ENT:  Negative for nose bleeds, sinus pain, painful swallowing CV:  Negative for chest pain, shortness of breath, exercise intolerance, palpitations, loss of  consciousness Resp:  Negative for cough, wheezing, shortness of breath GI:  Negative for nausea, vomiting, diarrhea, bloody stools GU:  Positives noted in HPI; otherwise negative for gross hematuria, dysuria, urinary incontinence Neuro:  Negative for seizures, poor balance, limb weakness, slurred speech Psych:  Negative for lack of energy, depression, anxiety Endocrine:  Negative for polydipsia, polyuria, symptoms of hypoglycemia (dizziness, hunger, sweating) Hematologic:  Negative for anemia, purpura, petechia, prolonged or excessive bleeding, use of anticoagulants  Allergic:  Negative for difficulty breathing or choking as a result of exposure to anything; no shellfish allergy; no allergic response (rash/itch) to materials, foods  Physical exam: There were no vitals taken for this visit. GENERAL APPEARANCE:  Well appearing, well developed, well nourished, NAD HEENT: Atraumatic, Normocephalic, oropharynx clear. NECK: Supple without lymphadenopathy or thyromegaly. LUNGS: Clear to auscultation bilaterally. HEART: Regular Rate  NEUROLOGIC:  Alert and oriented x 3, normal gait, CN II-XII grossly intact.  MENTAL STATUS:  Appropriate. BACK:  Non-tender to palpation.  No CVAT SKIN:  Warm, dry and intact.    Results:   I have reviewed referring/prior physicians records  I have reviewed urinalysis--clear  I have reviewed prior urine cultures  I reviewed prior imaging studies--CT images reviewed with her.  Stone is present on scout film.  Assessment: Right distal ureteral stone, basically at the ureterovesical junction.  To me stone measures just under 5 mm.  Overall, she is doing fairly well but still has intermittent pain   Plan: I would recommend continuing medical expulsive therapy at the present time  I discussed management with ureteroscopy/stent placement as well as shockwave lithotripsy.  I think the stone is amenable to either of these  She will let me know what she wants to  do over the weekend.  If she would like to eventually proceed with shockwave lithotripsy, we will work on trying to get that set up early in the week.  Prescription for oxycodone  sent in-she will continue on tamsulosin      [1]  Allergies Allergen Reactions   Dairy Aid [Tilactase]    Nickel Rash   Oxycodone -Acetaminophen  Nausea And Vomiting  [2]  Social History Tobacco Use   Smoking status: Never   Smokeless tobacco: Never  Vaping Use   Vaping status: Never Used  Substance Use Topics   Alcohol use: Yes   Drug use: No   "

## 2024-06-10 ENCOUNTER — Other Ambulatory Visit: Payer: Self-pay | Admitting: Urology

## 2024-06-10 DIAGNOSIS — N201 Calculus of ureter: Secondary | ICD-10-CM

## 2024-06-10 MED ORDER — HYDROCODONE-ACETAMINOPHEN 5-325 MG PO TABS: 1.0000 | ORAL_TABLET | Freq: Four times a day (QID) | ORAL | 0 refills | Status: AC | PRN

## 2024-06-10 MED ORDER — KETOROLAC TROMETHAMINE 10 MG PO TABS: 10.0000 mg | ORAL_TABLET | Freq: Four times a day (QID) | ORAL | 0 refills | Status: AC | PRN

## 2024-06-14 ENCOUNTER — Encounter: Payer: Self-pay | Admitting: Urology

## 2024-06-15 NOTE — Telephone Encounter (Signed)
 Please advise

## 2024-06-21 ENCOUNTER — Ambulatory Visit: Payer: Self-pay | Admitting: Urology

## 2024-06-21 DIAGNOSIS — N201 Calculus of ureter: Secondary | ICD-10-CM

## 2024-06-22 ENCOUNTER — Inpatient Hospital Stay (HOSPITAL_COMMUNITY): Admission: RE | Admit: 2024-06-22 | Source: Ambulatory Visit

## 2024-06-22 ENCOUNTER — Other Ambulatory Visit: Payer: Self-pay

## 2024-06-22 DIAGNOSIS — N200 Calculus of kidney: Secondary | ICD-10-CM

## 2024-06-22 NOTE — Progress Notes (Signed)
 have just called one of the directors @ [facility]. Katherene Solian. If the stone is in the region of the kidney the pt has to have been off of NSAIDS as well as blood thinners for 3 days. If the stone is outside of the renal contour (Luree's is deep in the pelvis), pt can cantinue on blood thinners. So it is still OK for us  to proceed w/ ESL tomorrow.  2 mins  Per secure chat from Dr.  GORMAN Shack

## 2024-06-22 NOTE — H&P (Signed)
 H&P  Chief Complaint: Kidney stone  History of Present Illness: Shirley Wang is a 29 y.o. year old female presenting at this time for shockwave lithotripsy of a persistently symptomatic right distal ureteral calculus.  I initially saw her on 23 December.  At that time she had been symptomatic for quite a few days, and had gone to the emergency room a week before where she was found to have a 6 mm right distal ureteral stone with moderate hydronephrosis.  Despite medical therapy, she continues to have persistent right abdominal pain.    Past Medical History:  Diagnosis Date   Allergic rhinitis    has used zyrtec  and nasonex in the past prn   COVID-19 11/21/2020   Heart disease    PDA (patent ductus arteriosus)    Pulmonic stenosis, congenital    Cong Valvar PS, ballooned at 11 months    Urinary tract infection 1999    Past Surgical History:  Procedure Laterality Date   CARDIAC SURGERY  11/2015   sinus venosus ASD and PAPVR repair, PDA ligation Jimmy [facility]TS at [facility])   CESAREAN SECTION N/A 02/27/2024   Procedure: CESAREAN DELIVERY;  Surgeon: Dannielle Bouchard, DO;  Location: [facility] LD ORS;  Service: Obstetrics;  Laterality: N/A;   PULMONARY VALVULOPLASTY     [facility]   WISDOM TOOTH EXTRACTION      Home Medications:  No medications prior to admission.    Allergies: Allergies[1]  Family History  Problem Relation Age of Onset   Breast cancer Mother 85       genetic testing negative   Hypertension Father    Crohn's disease Father    Diabetes type II Father        pre   Asthma Brother    Breast cancer Maternal Grandmother 48   Heart disease Neg Hx    Stroke Neg Hx     Social History:  reports that she has never smoked. She has never used smokeless tobacco. She reports current alcohol use. She reports that she does not use drugs.  ROS: A complete review of systems was performed.  All systems are negative except for pertinent findings as noted.  Physical Exam:  Vital signs in last  24 hours:   General:  Alert and oriented, No acute distress HEENT: Normocephalic, atraumatic Neck: No JVD or lymphadenopathy Cardiovascular: Regular rate  Lungs: Normal inspiratory/expiratory excursion Extremities: No edema Neurologic: Grossly intact  I have reviewed prior pt notes  I have reviewed urinalysis results  I have independently reviewed prior imaging--CT scan from prior visit as well as KUB from today reviewed.  Skin to stone distance 12 cm, Hounsfield units 800  I have reviewed prior urine culture   Impression/Assessment:  Dramatic 6 mm right distal ureteral stone  Plan:  Shockwave lithotripsy  Garnette HERO Meiah Zamudio 06/22/2024, 3:05 PM  Garnette HERO. Waunetta Riggle MD      [1]  Allergies Allergen Reactions   Dairy Aid [Tilactase]    Nickel Rash   Oxycodone -Acetaminophen  Nausea And Vomiting

## 2024-06-22 NOTE — Pre-Procedure Instructions (Signed)
 Patient called and told to be here for procedure tomorrow 06/23/2024 at 0700, To come in from entrance and register at front desk then come to short stay waiting area where the pre-op nurse will come and bring patient to pre-op area. Also reviewed medications ok to take in am. Pepcid, hydrocodone , flomax  with sips of H2O.   Patient verbalized understanding.

## 2024-06-23 ENCOUNTER — Encounter (HOSPITAL_COMMUNITY)
Admission: RE | Disposition: A | Discharge status: Home / Self Care | Payer: Self-pay | Source: Home / Self Care | Attending: Urology

## 2024-06-23 ENCOUNTER — Ambulatory Visit (HOSPITAL_COMMUNITY)
Admission: RE | Admit: 2024-06-23 | Discharge: 2024-06-23 | Disposition: A | Discharge status: Home / Self Care | Attending: Urology | Admitting: Urology

## 2024-06-23 ENCOUNTER — Ambulatory Visit (HOSPITAL_COMMUNITY)

## 2024-06-23 DIAGNOSIS — K219 Gastro-esophageal reflux disease without esophagitis: Secondary | ICD-10-CM | POA: Diagnosis not present

## 2024-06-23 DIAGNOSIS — K589 Irritable bowel syndrome without diarrhea: Secondary | ICD-10-CM | POA: Diagnosis not present

## 2024-06-23 DIAGNOSIS — I370 Nonrheumatic pulmonary valve stenosis: Secondary | ICD-10-CM | POA: Diagnosis not present

## 2024-06-23 DIAGNOSIS — N132 Hydronephrosis with renal and ureteral calculous obstruction: Secondary | ICD-10-CM | POA: Insufficient documentation

## 2024-06-23 DIAGNOSIS — N201 Calculus of ureter: Secondary | ICD-10-CM

## 2024-06-23 HISTORY — PX: EXTRACORPOREAL SHOCK WAVE LITHOTRIPSY: SHX1557

## 2024-06-23 LAB — POCT PREGNANCY, URINE: Preg Test, Ur: NEGATIVE

## 2024-06-23 SURGERY — LITHOTRIPSY, ESWL
Anesthesia: LOCAL | Laterality: Right

## 2024-06-23 MED ORDER — DIPHENHYDRAMINE HCL 25 MG PO CAPS
ORAL_CAPSULE | ORAL | Status: AC
  Administered 2024-06-23: 25 mg via ORAL
  Filled 2024-06-23: qty 1

## 2024-06-23 MED ORDER — CIPROFLOXACIN HCL 500 MG PO TABS
500.0000 mg | ORAL_TABLET | Freq: Once | ORAL | Status: DC
  Filled 2024-06-23: qty 1

## 2024-06-23 MED ORDER — CIPROFLOXACIN HCL 250 MG PO TABS
500.0000 mg | ORAL_TABLET | ORAL | Status: AC
  Administered 2024-06-23: 500 mg via ORAL
  Filled 2024-06-23: qty 2

## 2024-06-23 MED ORDER — DIPHENHYDRAMINE HCL 25 MG PO CAPS: 25.0000 mg | ORAL_CAPSULE | ORAL | Status: AC

## 2024-06-23 MED ORDER — DIAZEPAM 5 MG PO TABS
ORAL_TABLET | ORAL | Status: AC
  Administered 2024-06-23: 10 mg via ORAL
  Filled 2024-06-23: qty 2

## 2024-06-23 MED ORDER — SODIUM CHLORIDE 0.9 % IV SOLN: INTRAVENOUS | Status: DC

## 2024-06-23 MED ORDER — DIAZEPAM 5 MG PO TABS: 10.0000 mg | ORAL_TABLET | ORAL | Status: AC

## 2024-06-23 NOTE — Discharge Instructions (Signed)
 See [facility] discharge instructions in chart.

## 2024-06-23 NOTE — Op Note (Signed)
See [facility] OP note scanned into chart. 

## 2024-06-24 ENCOUNTER — Encounter (HOSPITAL_COMMUNITY): Payer: Self-pay | Admitting: Urology

## 2024-06-24 ENCOUNTER — Ambulatory Visit: Payer: Self-pay | Admitting: Family

## 2024-07-08 ENCOUNTER — Encounter: Admitting: Urology

## 2024-07-20 ENCOUNTER — Encounter: Admitting: Urology

## 2024-07-20 DIAGNOSIS — N201 Calculus of ureter: Secondary | ICD-10-CM

## 2024-08-19 ENCOUNTER — Encounter: Admitting: Urology

## 2024-08-25 ENCOUNTER — Other Ambulatory Visit (HOSPITAL_BASED_OUTPATIENT_CLINIC_OR_DEPARTMENT_OTHER)

## 2024-09-07 ENCOUNTER — Ambulatory Visit (HOSPITAL_BASED_OUTPATIENT_CLINIC_OR_DEPARTMENT_OTHER): Admitting: Cardiology
